# Patient Record
Sex: Male | Born: 1975 | Race: Black or African American | Hispanic: No | Marital: Married | Smoking: Current every day smoker
Health system: Southern US, Community
[De-identification: ages and names within clinical notes are randomized; demographics above are authoritative.]

## PROBLEM LIST (undated history)

## (undated) DIAGNOSIS — M4300 Spondylolysis, site unspecified: Secondary | ICD-10-CM

## (undated) HISTORY — PX: BACK SURGERY: SHX140

---

## 2001-08-23 ENCOUNTER — Encounter: Payer: Self-pay | Admitting: Emergency Medicine

## 2001-08-23 ENCOUNTER — Emergency Department (HOSPITAL_COMMUNITY): Admission: EM | Admit: 2001-08-23 | Discharge: 2001-08-23 | Payer: Self-pay | Admitting: Emergency Medicine

## 2018-02-24 ENCOUNTER — Emergency Department (HOSPITAL_COMMUNITY)
Admission: EM | Admit: 2018-02-24 | Discharge: 2018-02-25 | Disposition: A | Discharge status: Home / Self Care | Payer: No Typology Code available for payment source | Attending: Emergency Medicine | Admitting: Emergency Medicine

## 2018-02-24 ENCOUNTER — Other Ambulatory Visit: Payer: Self-pay

## 2018-02-24 ENCOUNTER — Emergency Department (HOSPITAL_COMMUNITY): Payer: No Typology Code available for payment source

## 2018-02-24 ENCOUNTER — Encounter (HOSPITAL_COMMUNITY): Payer: Self-pay | Admitting: Emergency Medicine

## 2018-02-24 DIAGNOSIS — M4802 Spinal stenosis, cervical region: Secondary | ICD-10-CM | POA: Diagnosis not present

## 2018-02-24 DIAGNOSIS — M4807 Spinal stenosis, lumbosacral region: Secondary | ICD-10-CM | POA: Diagnosis not present

## 2018-02-24 DIAGNOSIS — Y92410 Unspecified street and highway as the place of occurrence of the external cause: Secondary | ICD-10-CM | POA: Diagnosis not present

## 2018-02-24 DIAGNOSIS — Y998 Other external cause status: Secondary | ICD-10-CM | POA: Insufficient documentation

## 2018-02-24 DIAGNOSIS — R200 Anesthesia of skin: Secondary | ICD-10-CM | POA: Diagnosis not present

## 2018-02-24 DIAGNOSIS — Y939 Activity, unspecified: Secondary | ICD-10-CM | POA: Insufficient documentation

## 2018-02-24 DIAGNOSIS — Z789 Other specified health status: Secondary | ICD-10-CM

## 2018-02-24 DIAGNOSIS — M5489 Other dorsalgia: Secondary | ICD-10-CM | POA: Diagnosis not present

## 2018-02-24 DIAGNOSIS — M549 Dorsalgia, unspecified: Secondary | ICD-10-CM

## 2018-02-24 DIAGNOSIS — S0990XA Unspecified injury of head, initial encounter: Secondary | ICD-10-CM | POA: Diagnosis present

## 2018-02-24 LAB — COMPREHENSIVE METABOLIC PANEL
ALT: 17 U/L (ref 0–44)
AST: 30 U/L (ref 15–41)
Albumin: 3.8 g/dL (ref 3.5–5.0)
Alkaline Phosphatase: 65 U/L (ref 38–126)
Anion gap: 8 (ref 5–15)
BUN: 14 mg/dL (ref 6–20)
CO2: 22 mmol/L (ref 22–32)
Calcium: 9.1 mg/dL (ref 8.9–10.3)
Chloride: 107 mmol/L (ref 98–111)
Creatinine, Ser: 1.27 mg/dL — ABNORMAL HIGH (ref 0.61–1.24)
GFR calc Af Amer: >60 mL/min (ref >60)
GFR calc non Af Amer: >60 mL/min (ref >60)
Glucose, Bld: 120 mg/dL — ABNORMAL HIGH (ref 70–99)
Potassium: 3.5 mmol/L (ref 3.5–5.1)
Sodium: 137 mmol/L (ref 135–145)
Total Bilirubin: 0.9 mg/dL (ref 0.3–1.2)
Total Protein: 7.1 g/dL (ref 6.5–8.1)

## 2018-02-24 LAB — CBC
HCT: 40.1 % (ref 39.0–52.0)
Hemoglobin: 13.1 g/dL (ref 13.0–17.0)
MCH: 28.7 pg (ref 26.0–34.0)
MCHC: 32.7 g/dL (ref 30.0–36.0)
MCV: 87.7 fL (ref 80.0–100.0)
Platelets: 304 10*3/uL (ref 150–400)
RBC: 4.57 MIL/uL (ref 4.22–5.81)
RDW: 13.2 % (ref 11.5–15.5)
WBC: 11.2 10*3/uL — ABNORMAL HIGH (ref 4.0–10.5)
nRBC: 0 % (ref 0.0–0.2)

## 2018-02-24 LAB — I-STAT CHEM 8, ED
BUN: 16 mg/dL (ref 6–20)
Calcium, Ion: 1.13 mmol/L — ABNORMAL LOW (ref 1.15–1.40)
Chloride: 104 mmol/L (ref 98–111)
Creatinine, Ser: 1.3 mg/dL — ABNORMAL HIGH (ref 0.61–1.24)
Glucose, Bld: 117 mg/dL — ABNORMAL HIGH (ref 70–99)
HCT: 42 % (ref 39.0–52.0)
Hemoglobin: 14.3 g/dL (ref 13.0–17.0)
Potassium: 3.7 mmol/L (ref 3.5–5.1)
Sodium: 138 mmol/L (ref 135–145)
TCO2: 26 mmol/L (ref 22–32)

## 2018-02-24 LAB — PROTIME-INR
INR: 0.92
Prothrombin Time: 12.3 seconds (ref 11.4–15.2)

## 2018-02-24 LAB — I-STAT CG4 LACTIC ACID, ED: Lactic Acid, Venous: 1.48 mmol/L (ref 0.5–1.9)

## 2018-02-24 LAB — ETHANOL: Alcohol, Ethyl (B): <10 mg/dL (ref <10)

## 2018-02-24 MED ORDER — IOHEXOL 300 MG/ML  SOLN
100.0000 mL | Freq: Once | INTRAMUSCULAR | Status: AC | PRN
  Administered 2018-02-24: 100 mL via INTRAVENOUS

## 2018-02-24 MED ORDER — DIAZEPAM 5 MG/ML IJ SOLN
5.0000 mg | Freq: Once | INTRAMUSCULAR | Status: AC
  Administered 2018-02-24: 5 mg via INTRAVENOUS
  Filled 2018-02-24: qty 2

## 2018-02-24 MED ORDER — HYDROMORPHONE HCL 1 MG/ML IJ SOLN
1.0000 mg | Freq: Once | INTRAMUSCULAR | Status: AC
  Administered 2018-02-24: 1 mg via INTRAVENOUS
  Filled 2018-02-24: qty 1

## 2018-02-24 MED ORDER — ONDANSETRON HCL 4 MG/2ML IJ SOLN
4.0000 mg | Freq: Once | INTRAMUSCULAR | Status: AC
  Administered 2018-02-24: 4 mg via INTRAVENOUS
  Filled 2018-02-24: qty 2
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
## 2018-02-24 NOTE — ED Notes (Signed)
Patient transported to CT 

## 2018-02-25 ENCOUNTER — Emergency Department (HOSPITAL_COMMUNITY): Payer: No Typology Code available for payment source

## 2018-02-25 LAB — CDS SEROLOGY

## 2018-02-25 MED ORDER — HYDROCODONE-ACETAMINOPHEN 5-325 MG PO TABS: 1.0000 | ORAL_TABLET | Freq: Three times a day (TID) | ORAL | 0 refills | Status: AC | PRN

## 2018-02-25 MED ORDER — HYDROMORPHONE HCL 1 MG/ML IJ SOLN
0.5000 mg | Freq: Once | INTRAMUSCULAR | Status: AC
  Administered 2018-02-25: 0.5 mg via INTRAVENOUS
  Filled 2018-02-25: qty 1

## 2018-02-25 MED ORDER — ACETAMINOPHEN 500 MG PO TABS: 1000.0000 mg | ORAL_TABLET | Freq: Three times a day (TID) | ORAL | 0 refills | Status: AC

## 2018-02-25 MED ORDER — HYDROCODONE-ACETAMINOPHEN 5-325 MG PO TABS
1.0000 | ORAL_TABLET | Freq: Once | ORAL | Status: AC
  Administered 2018-02-25: 1 via ORAL
  Filled 2018-02-25: qty 1

## 2018-02-25 MED ORDER — METHYLPREDNISOLONE 4 MG PO TBPK: ORAL_TABLET | ORAL | 0 refills | Status: DC

## 2018-02-25 MED ORDER — DEXAMETHASONE SODIUM PHOSPHATE 10 MG/ML IJ SOLN
10.0000 mg | Freq: Once | INTRAMUSCULAR | Status: AC
  Administered 2018-02-25: 10 mg via INTRAVENOUS
  Filled 2018-02-25: qty 1

## 2018-02-25 NOTE — Discharge Instructions (Addendum)
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  - Disc bulging and facet hypertrophy at L5-S1 with resultant  moderate to advanced bilateral L5 foraminal stenosis.
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
## 2018-02-25 NOTE — ED Notes (Signed)
Patient transported to MRI 

## 2018-03-06 ENCOUNTER — Encounter (HOSPITAL_COMMUNITY): Payer: Self-pay | Admitting: Emergency Medicine

## 2018-03-06 ENCOUNTER — Inpatient Hospital Stay (HOSPITAL_COMMUNITY)
Admission: EM | Admit: 2018-03-06 | Discharge: 2018-03-08 | DRG: 029 | Disposition: A | Discharge status: Home / Self Care | Payer: No Typology Code available for payment source | Attending: Neurological Surgery | Admitting: Neurological Surgery

## 2018-03-06 ENCOUNTER — Inpatient Hospital Stay (HOSPITAL_COMMUNITY): Payer: No Typology Code available for payment source

## 2018-03-06 DIAGNOSIS — Y92410 Unspecified street and highway as the place of occurrence of the external cause: Secondary | ICD-10-CM

## 2018-03-06 DIAGNOSIS — Q7649 Other congenital malformations of spine, not associated with scoliosis: Secondary | ICD-10-CM

## 2018-03-06 DIAGNOSIS — Z79899 Other long term (current) drug therapy: Secondary | ICD-10-CM

## 2018-03-06 DIAGNOSIS — F1910 Other psychoactive substance abuse, uncomplicated: Secondary | ICD-10-CM | POA: Diagnosis present

## 2018-03-06 DIAGNOSIS — M2578 Osteophyte, vertebrae: Secondary | ICD-10-CM | POA: Diagnosis present

## 2018-03-06 DIAGNOSIS — M479 Spondylosis, unspecified: Secondary | ICD-10-CM | POA: Diagnosis present

## 2018-03-06 DIAGNOSIS — Y9301 Activity, walking, marching and hiking: Secondary | ICD-10-CM | POA: Diagnosis present

## 2018-03-06 DIAGNOSIS — W19XXXA Unspecified fall, initial encounter: Secondary | ICD-10-CM | POA: Diagnosis present

## 2018-03-06 DIAGNOSIS — Z419 Encounter for procedure for purposes other than remedying health state, unspecified: Secondary | ICD-10-CM

## 2018-03-06 DIAGNOSIS — G8191 Hemiplegia, unspecified affecting right dominant side: Secondary | ICD-10-CM | POA: Diagnosis present

## 2018-03-06 DIAGNOSIS — R208 Other disturbances of skin sensation: Secondary | ICD-10-CM | POA: Diagnosis present

## 2018-03-06 DIAGNOSIS — M4802 Spinal stenosis, cervical region: Secondary | ICD-10-CM | POA: Diagnosis present

## 2018-03-06 DIAGNOSIS — R200 Anesthesia of skin: Secondary | ICD-10-CM | POA: Diagnosis present

## 2018-03-06 DIAGNOSIS — G959 Disease of spinal cord, unspecified: Secondary | ICD-10-CM | POA: Diagnosis present

## 2018-03-06 DIAGNOSIS — S14129A Central cord syndrome at unspecified level of cervical spinal cord, initial encounter: Principal | ICD-10-CM | POA: Diagnosis present

## 2018-03-06 DIAGNOSIS — S14109A Unspecified injury at unspecified level of cervical spinal cord, initial encounter: Secondary | ICD-10-CM | POA: Diagnosis present

## 2018-03-06 HISTORY — DX: Spondylolysis, site unspecified: M43.00

## 2018-03-06 LAB — CBC WITH DIFFERENTIAL/PLATELET
Abs Immature Granulocytes: 0.04 10*3/uL (ref 0.00–0.07)
Basophils Absolute: 0.1 10*3/uL (ref 0.0–0.1)
Basophils Relative: 1 %
Eosinophils Absolute: 0.6 10*3/uL — ABNORMAL HIGH (ref 0.0–0.5)
Eosinophils Relative: 5 %
HCT: 41.7 % (ref 39.0–52.0)
Hemoglobin: 13.5 g/dL (ref 13.0–17.0)
Immature Granulocytes: 0 %
Lymphocytes Relative: 28 %
Lymphs Abs: 3.4 10*3/uL (ref 0.7–4.0)
MCH: 28.5 pg (ref 26.0–34.0)
MCHC: 32.4 g/dL (ref 30.0–36.0)
MCV: 88.2 fL (ref 80.0–100.0)
Monocytes Absolute: 0.8 10*3/uL (ref 0.1–1.0)
Monocytes Relative: 7 %
Neutro Abs: 7 10*3/uL (ref 1.7–7.7)
Neutrophils Relative %: 59 %
Platelets: 315 10*3/uL (ref 150–400)
RBC: 4.73 MIL/uL (ref 4.22–5.81)
RDW: 12.9 % (ref 11.5–15.5)
WBC: 11.9 10*3/uL — ABNORMAL HIGH (ref 4.0–10.5)
nRBC: 0 % (ref 0.0–0.2)

## 2018-03-06 LAB — PROTIME-INR
INR: 0.98
Prothrombin Time: 12.8 seconds (ref 11.4–15.2)

## 2018-03-06 LAB — COMPREHENSIVE METABOLIC PANEL
ALT: 19 U/L (ref 0–44)
AST: 21 U/L (ref 15–41)
Albumin: 3.8 g/dL (ref 3.5–5.0)
Alkaline Phosphatase: 77 U/L (ref 38–126)
Anion gap: 10 (ref 5–15)
BUN: 9 mg/dL (ref 6–20)
CO2: 23 mmol/L (ref 22–32)
Calcium: 9.1 mg/dL (ref 8.9–10.3)
Chloride: 104 mmol/L (ref 98–111)
Creatinine, Ser: 1.13 mg/dL (ref 0.61–1.24)
GFR calc Af Amer: >60 mL/min (ref >60)
GFR calc non Af Amer: >60 mL/min (ref >60)
Glucose, Bld: 117 mg/dL — ABNORMAL HIGH (ref 70–99)
Potassium: 4.2 mmol/L (ref 3.5–5.1)
Sodium: 137 mmol/L (ref 135–145)
Total Bilirubin: 1.2 mg/dL (ref 0.3–1.2)
Total Protein: 7.2 g/dL (ref 6.5–8.1)

## 2018-03-06 LAB — RAPID URINE DRUG SCREEN, HOSP PERFORMED
Amphetamines: NOT DETECTED
Barbiturates: NOT DETECTED
Benzodiazepines: NOT DETECTED
Cocaine: POSITIVE — AB
Opiates: POSITIVE — AB
Tetrahydrocannabinol: NOT DETECTED

## 2018-03-06 MED ORDER — ONDANSETRON HCL 4 MG PO TABS: 4.0000 mg | ORAL_TABLET | Freq: Four times a day (QID) | ORAL | Status: DC | PRN

## 2018-03-06 MED ORDER — OXYCODONE HCL 5 MG PO TABS
10.0000 mg | ORAL_TABLET | ORAL | Status: DC | PRN
  Administered 2018-03-07 – 2018-03-08 (×4): 10 mg via ORAL
  Filled 2018-03-06 (×4): qty 2

## 2018-03-06 MED ORDER — ACETAMINOPHEN 650 MG RE SUPP: 650.0000 mg | RECTAL | Status: DC | PRN

## 2018-03-06 MED ORDER — SODIUM CHLORIDE 0.9% FLUSH: 3.0000 mL | INTRAVENOUS | Status: DC | PRN

## 2018-03-06 MED ORDER — SODIUM CHLORIDE 0.9 % IV SOLN: INTRAVENOUS | Status: DC

## 2018-03-06 MED ORDER — ONDANSETRON HCL 4 MG/2ML IJ SOLN: 4.0000 mg | Freq: Four times a day (QID) | INTRAMUSCULAR | Status: DC | PRN

## 2018-03-06 MED ORDER — OXYCODONE HCL 5 MG PO TABS
5.0000 mg | ORAL_TABLET | ORAL | Status: DC | PRN
  Filled 2018-03-06: qty 1

## 2018-03-06 MED ORDER — HYDROMORPHONE HCL 1 MG/ML IJ SOLN
0.5000 mg | Freq: Once | INTRAMUSCULAR | Status: AC
  Administered 2018-03-06: 0.5 mg via INTRAVENOUS
  Filled 2018-03-06: qty 1

## 2018-03-06 MED ORDER — DOCUSATE SODIUM 100 MG PO CAPS
100.0000 mg | ORAL_CAPSULE | Freq: Two times a day (BID) | ORAL | Status: DC
  Administered 2018-03-08: 100 mg via ORAL
  Filled 2018-03-06: qty 1

## 2018-03-06 MED ORDER — DIAZEPAM 5 MG PO TABS
10.0000 mg | ORAL_TABLET | Freq: Four times a day (QID) | ORAL | Status: DC | PRN
  Administered 2018-03-07 (×2): 10 mg via ORAL
  Filled 2018-03-06 (×2): qty 2

## 2018-03-06 MED ORDER — ACETAMINOPHEN 325 MG PO TABS: 650.0000 mg | ORAL_TABLET | ORAL | Status: DC | PRN

## 2018-03-06 MED ORDER — PHENOL 1.4 % MT LIQD
1.0000 | OROMUCOSAL | Status: DC | PRN
  Filled 2018-03-06: qty 177

## 2018-03-06 MED ORDER — SODIUM CHLORIDE 0.9% FLUSH
3.0000 mL | Freq: Two times a day (BID) | INTRAVENOUS | Status: DC
  Administered 2018-03-07 – 2018-03-08 (×2): 3 mL via INTRAVENOUS

## 2018-03-06 MED ORDER — HYDROMORPHONE HCL 1 MG/ML IJ SOLN
0.5000 mg | INTRAMUSCULAR | Status: DC | PRN
  Administered 2018-03-07 – 2018-03-08 (×2): 0.5 mg via INTRAVENOUS
  Filled 2018-03-06 (×3): qty 1

## 2018-03-06 MED ORDER — MENTHOL 3 MG MT LOZG
1.0000 | LOZENGE | OROMUCOSAL | Status: DC | PRN
  Filled 2018-03-06: qty 9

## 2018-03-06 MED ORDER — SODIUM CHLORIDE 0.9 % IV SOLN: 250.0000 mL | INTRAVENOUS | Status: DC
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
## 2018-03-06 NOTE — ED Notes (Signed)
Urine Culture sent to main lab with urine drug screen.

## 2018-03-06 NOTE — ED Notes (Signed)
Aspen collar placed with Dr. Maurice Small.
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
## 2018-03-06 NOTE — ED Notes (Signed)
Patient transported to CT 
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
## 2018-03-07 ENCOUNTER — Inpatient Hospital Stay (HOSPITAL_COMMUNITY): Payer: No Typology Code available for payment source | Admitting: Anesthesiology

## 2018-03-07 ENCOUNTER — Inpatient Hospital Stay (HOSPITAL_COMMUNITY): Payer: No Typology Code available for payment source

## 2018-03-07 ENCOUNTER — Encounter (HOSPITAL_COMMUNITY)
Admission: EM | Disposition: A | Discharge status: Home / Self Care | Payer: Self-pay | Source: Home / Self Care | Attending: Neurological Surgery

## 2018-03-07 ENCOUNTER — Encounter (HOSPITAL_COMMUNITY): Payer: Self-pay | Admitting: *Deleted

## 2018-03-07 HISTORY — PX: POSTERIOR CERVICAL FUSION/FORAMINOTOMY: SHX5038

## 2018-03-07 LAB — SURGICAL PCR SCREEN
MRSA, PCR: POSITIVE — AB
Staphylococcus aureus: POSITIVE — AB

## 2018-03-07 SURGERY — POSTERIOR CERVICAL FUSION/FORAMINOTOMY LEVEL 3
Anesthesia: General | Site: Spine Cervical

## 2018-03-07 MED ORDER — DEXAMETHASONE SODIUM PHOSPHATE 10 MG/ML IJ SOLN
INTRAMUSCULAR | Status: DC | PRN
  Administered 2018-03-07: 10 mg via INTRAVENOUS

## 2018-03-07 MED ORDER — LIDOCAINE 2% (20 MG/ML) 5 ML SYRINGE
INTRAMUSCULAR | Status: DC | PRN
  Administered 2018-03-07: 60 mg via INTRAVENOUS

## 2018-03-07 MED ORDER — CHLORHEXIDINE GLUCONATE CLOTH 2 % EX PADS
6.0000 | MEDICATED_PAD | Freq: Every day | CUTANEOUS | Status: DC
  Administered 2018-03-08: 6 via TOPICAL

## 2018-03-07 MED ORDER — PROPOFOL 10 MG/ML IV BOLUS
INTRAVENOUS | Status: DC | PRN
  Administered 2018-03-07: 20 mg via INTRAVENOUS
  Administered 2018-03-07: 160 mg via INTRAVENOUS
  Administered 2018-03-07: 20 mg via INTRAVENOUS

## 2018-03-07 MED ORDER — BACITRACIN ZINC 500 UNIT/GM EX OINT
TOPICAL_OINTMENT | CUTANEOUS | Status: AC
  Filled 2018-03-07: qty 28.35

## 2018-03-07 MED ORDER — THROMBIN 5000 UNITS EX SOLR
CUTANEOUS | Status: AC
  Filled 2018-03-07: qty 5000

## 2018-03-07 MED ORDER — BACITRACIN ZINC 500 UNIT/GM EX OINT
TOPICAL_OINTMENT | CUTANEOUS | Status: DC | PRN
  Administered 2018-03-07: 1 via TOPICAL

## 2018-03-07 MED ORDER — FENTANYL CITRATE (PF) 250 MCG/5ML IJ SOLN
INTRAMUSCULAR | Status: DC | PRN
  Administered 2018-03-07: 25 ug via INTRAVENOUS
  Administered 2018-03-07: 100 ug via INTRAVENOUS
  Administered 2018-03-07: 75 ug via INTRAVENOUS
  Administered 2018-03-07: 50 ug via INTRAVENOUS

## 2018-03-07 MED ORDER — CEFAZOLIN SODIUM-DEXTROSE 2-4 GM/100ML-% IV SOLN
INTRAVENOUS | Status: AC
  Filled 2018-03-07: qty 100

## 2018-03-07 MED ORDER — HYDROMORPHONE HCL 1 MG/ML IJ SOLN: 0.2500 mg | INTRAMUSCULAR | Status: DC | PRN

## 2018-03-07 MED ORDER — ONDANSETRON HCL 4 MG/2ML IJ SOLN
INTRAMUSCULAR | Status: DC | PRN
  Administered 2018-03-07: 4 mg via INTRAVENOUS

## 2018-03-07 MED ORDER — SODIUM CHLORIDE 0.9 % IV SOLN
INTRAVENOUS | Status: DC | PRN
  Administered 2018-03-07: 45 ug/min via INTRAVENOUS

## 2018-03-07 MED ORDER — LIDOCAINE-EPINEPHRINE 1 %-1:100000 IJ SOLN
INTRAMUSCULAR | Status: DC | PRN
  Administered 2018-03-07: 7 mL

## 2018-03-07 MED ORDER — LIDOCAINE-EPINEPHRINE 1 %-1:100000 IJ SOLN
INTRAMUSCULAR | Status: AC
  Filled 2018-03-07: qty 1

## 2018-03-07 MED ORDER — MIDAZOLAM HCL 2 MG/2ML IJ SOLN
INTRAMUSCULAR | Status: AC
  Filled 2018-03-07: qty 2

## 2018-03-07 MED ORDER — THROMBIN 5000 UNITS EX SOLR
OROMUCOSAL | Status: DC | PRN
  Administered 2018-03-07: 16:00:00

## 2018-03-07 MED ORDER — 0.9 % SODIUM CHLORIDE (POUR BTL) OPTIME
TOPICAL | Status: DC | PRN
  Administered 2018-03-07: 1000 mL

## 2018-03-07 MED ORDER — ROCURONIUM BROMIDE 50 MG/5ML IV SOSY
PREFILLED_SYRINGE | INTRAVENOUS | Status: DC | PRN
  Administered 2018-03-07: 10 mg via INTRAVENOUS
  Administered 2018-03-07: 50 mg via INTRAVENOUS

## 2018-03-07 MED ORDER — SODIUM CHLORIDE 0.9 % IV SOLN
INTRAVENOUS | Status: DC | PRN
  Administered 2018-03-07: 16:00:00

## 2018-03-07 MED ORDER — ESMOLOL HCL 100 MG/10ML IV SOLN
INTRAVENOUS | Status: AC
  Filled 2018-03-07: qty 10

## 2018-03-07 MED ORDER — ESMOLOL HCL 100 MG/10ML IV SOLN
INTRAVENOUS | Status: DC | PRN
  Administered 2018-03-07: 30 mg via INTRAVENOUS

## 2018-03-07 MED ORDER — GLYCOPYRROLATE PF 0.2 MG/ML IJ SOSY
PREFILLED_SYRINGE | INTRAMUSCULAR | Status: AC
  Filled 2018-03-07: qty 1

## 2018-03-07 MED ORDER — PHENYLEPHRINE HCL 10 MG/ML IJ SOLN
INTRAMUSCULAR | Status: DC | PRN
  Administered 2018-03-07 (×3): 80 ug via INTRAVENOUS

## 2018-03-07 MED ORDER — ROCURONIUM BROMIDE 50 MG/5ML IV SOSY
PREFILLED_SYRINGE | INTRAVENOUS | Status: AC
  Filled 2018-03-07: qty 5

## 2018-03-07 MED ORDER — DEXAMETHASONE SODIUM PHOSPHATE 10 MG/ML IJ SOLN
INTRAMUSCULAR | Status: AC
  Filled 2018-03-07: qty 1

## 2018-03-07 MED ORDER — MIDAZOLAM HCL 2 MG/2ML IJ SOLN
INTRAMUSCULAR | Status: DC | PRN
  Administered 2018-03-07: 2 mg via INTRAVENOUS

## 2018-03-07 MED ORDER — PROPOFOL 10 MG/ML IV BOLUS
INTRAVENOUS | Status: AC
  Filled 2018-03-07: qty 20

## 2018-03-07 MED ORDER — FENTANYL CITRATE (PF) 250 MCG/5ML IJ SOLN
INTRAMUSCULAR | Status: AC
  Filled 2018-03-07: qty 5

## 2018-03-07 MED ORDER — LIDOCAINE 2% (20 MG/ML) 5 ML SYRINGE
INTRAMUSCULAR | Status: AC
  Filled 2018-03-07: qty 5

## 2018-03-07 MED ORDER — MUPIROCIN 2 % EX OINT
1.0000 "application " | TOPICAL_OINTMENT | Freq: Two times a day (BID) | CUTANEOUS | Status: DC
  Administered 2018-03-08: 1 via NASAL
  Filled 2018-03-07: qty 22

## 2018-03-07 MED ORDER — LACTATED RINGERS IV SOLN
INTRAVENOUS | Status: DC
  Administered 2018-03-07 (×2): via INTRAVENOUS

## 2018-03-07 MED ORDER — EPHEDRINE SULFATE 50 MG/ML IJ SOLN
INTRAMUSCULAR | Status: DC | PRN
  Administered 2018-03-07: 10 mg via INTRAVENOUS

## 2018-03-07 MED ORDER — GLYCOPYRROLATE 0.2 MG/ML IJ SOLN
INTRAMUSCULAR | Status: DC | PRN
  Administered 2018-03-07: 0.1 mg via INTRAVENOUS

## 2018-03-07 MED ORDER — ONDANSETRON HCL 4 MG/2ML IJ SOLN
INTRAMUSCULAR | Status: AC
  Filled 2018-03-07: qty 2

## 2018-03-07 MED ORDER — PROPOFOL 10 MG/ML IV BOLUS
INTRAVENOUS | Status: AC
  Filled 2018-03-07: qty 40

## 2018-03-07 MED ORDER — SUGAMMADEX SODIUM 200 MG/2ML IV SOLN
INTRAVENOUS | Status: DC | PRN
  Administered 2018-03-07: 158 mg via INTRAVENOUS

## 2018-03-07 SURGICAL SUPPLY — 64 items
ADH SKN CLS APL DERMABOND .7 (GAUZE/BANDAGES/DRESSINGS) ×1
APL SKNCLS STERI-STRIP NONHPOA (GAUZE/BANDAGES/DRESSINGS)
BAG DECANTER FOR FLEXI CONT (MISCELLANEOUS) ×3 IMPLANT
BENZOIN TINCTURE PRP APPL 2/3 (GAUZE/BANDAGES/DRESSINGS) IMPLANT
BIT DRILL CENTERPIECE (BIT) IMPLANT
BIT DRILL NEURO 2X3.1 SFT TUCH (MISCELLANEOUS) IMPLANT
BLADE CLIPPER SURG (BLADE) ×3 IMPLANT
BUR MATCHSTICK NEURO 3.0 LAGG (BURR) IMPLANT
BUR PRECISION FLUTE 5.0 (BURR) ×3 IMPLANT
CANISTER SUCT 3000ML PPV (MISCELLANEOUS) ×3 IMPLANT
COVER WAND RF STERILE (DRAPES) ×3 IMPLANT
DECANTER SPIKE VIAL GLASS SM (MISCELLANEOUS) ×3 IMPLANT
DERMABOND ADVANCED (GAUZE/BANDAGES/DRESSINGS) ×2
DERMABOND ADVANCED .7 DNX12 (GAUZE/BANDAGES/DRESSINGS) ×1 IMPLANT
DRAPE C-ARM 42X72 X-RAY (DRAPES) ×6 IMPLANT
DRAPE LAPAROTOMY 100X72 PEDS (DRAPES) ×3 IMPLANT
DRILL BIT CENTERPIECE (BIT) ×3
DRILL NEURO 2X3.1 SOFT TOUCH (MISCELLANEOUS) ×3
DURAPREP 6ML APPLICATOR 50/CS (WOUND CARE) ×3 IMPLANT
ELECT REM PT RETURN 9FT ADLT (ELECTROSURGICAL) ×3
ELECTRODE REM PT RTRN 9FT ADLT (ELECTROSURGICAL) ×1 IMPLANT
GAUZE 4X4 16PLY RFD (DISPOSABLE) IMPLANT
GAUZE SPONGE 4X4 12PLY STRL (GAUZE/BANDAGES/DRESSINGS) IMPLANT
GLOVE BIO SURGEON STRL SZ 6.5 (GLOVE) ×3 IMPLANT
GLOVE BIO SURGEON STRL SZ7 (GLOVE) ×3 IMPLANT
GLOVE BIO SURGEONS STRL SZ 6.5 (GLOVE) ×3
GLOVE BIOGEL PI IND STRL 6.5 (GLOVE) IMPLANT
GLOVE BIOGEL PI IND STRL 7.5 (GLOVE) ×1 IMPLANT
GLOVE BIOGEL PI IND STRL 8 (GLOVE) IMPLANT
GLOVE BIOGEL PI INDICATOR 6.5 (GLOVE) ×4
GLOVE BIOGEL PI INDICATOR 7.5 (GLOVE) ×2
GLOVE BIOGEL PI INDICATOR 8 (GLOVE) ×2
GLOVE EXAM NITRILE LRG STRL (GLOVE) IMPLANT
GLOVE EXAM NITRILE XL STR (GLOVE) IMPLANT
GLOVE EXAM NITRILE XS STR PU (GLOVE) IMPLANT
GOWN STRL REUS W/ TWL LRG LVL3 (GOWN DISPOSABLE) IMPLANT
GOWN STRL REUS W/ TWL XL LVL3 (GOWN DISPOSABLE) ×1 IMPLANT
GOWN STRL REUS W/TWL 2XL LVL3 (GOWN DISPOSABLE) IMPLANT
GOWN STRL REUS W/TWL LRG LVL3 (GOWN DISPOSABLE)
GOWN STRL REUS W/TWL XL LVL3 (GOWN DISPOSABLE) ×3
HEMOSTAT POWDER KIT SURGIFOAM (HEMOSTASIS) ×3 IMPLANT
KIT BASIN OR (CUSTOM PROCEDURE TRAY) ×3 IMPLANT
KIT TURNOVER KIT B (KITS) ×3 IMPLANT
NEEDLE HYPO 22GX1.5 SAFETY (NEEDLE) ×3 IMPLANT
NS IRRIG 1000ML POUR BTL (IV SOLUTION) ×3 IMPLANT
PACK LAMINECTOMY NEURO (CUSTOM PROCEDURE TRAY) ×3 IMPLANT
PAD ARMBOARD 7.5X6 YLW CONV (MISCELLANEOUS) ×9 IMPLANT
PIN MAYFIELD SKULL DISP (PIN) ×3 IMPLANT
PLATE CENTERPIECE 10 TITANIUM (Plate) ×6 IMPLANT
PLATE OPENDOOR CENTERPIECE 10M (Plate) ×2 IMPLANT
PLATE SPINAL OVAL MINI (Plate) ×8 IMPLANT
SCREW BONE 5MM (Screw) ×40 IMPLANT
SCREW SPINAL S/T POST 2.6X7 (Screw) ×22 IMPLANT
SPONGE LAP 4X18 RFD (DISPOSABLE) IMPLANT
STAPLER VISISTAT 35W (STAPLE) ×3 IMPLANT
SUT ETHILON 3 0 FSL (SUTURE) IMPLANT
SUT MNCRL AB 3-0 PS2 18 (SUTURE) ×3 IMPLANT
SUT VIC AB 0 CT1 18XCR BRD8 (SUTURE) ×1 IMPLANT
SUT VIC AB 0 CT1 8-18 (SUTURE) ×3
SUT VIC AB 2-0 CT1 18 (SUTURE) ×7 IMPLANT
TOWEL GREEN STERILE (TOWEL DISPOSABLE) ×3 IMPLANT
TOWEL GREEN STERILE FF (TOWEL DISPOSABLE) ×3 IMPLANT
UNDERPAD 30X30 (UNDERPADS AND DIAPERS) ×3 IMPLANT
WATER STERILE IRR 1000ML POUR (IV SOLUTION) ×3 IMPLANT

## 2018-03-07 NOTE — Anesthesia Preprocedure Evaluation (Addendum)
Anesthesia Evaluation  Patient identified by MRN, date of birth, ID band Patient awake    Reviewed: Allergy & Precautions, H&P , NPO status , Patient's Chart, lab work & pertinent test results  Airway Mallampati: II  TM Distance: >3 FB Neck ROM: Full    Dental no notable dental hx. (+) Poor Dentition, Dental Advisory Given   Pulmonary neg pulmonary ROS,    Pulmonary exam normal breath sounds clear to auscultation       Cardiovascular negative cardio ROS   Rhythm:Regular Rate:Normal     Neuro/Psych negative neurological ROS  negative psych ROS   GI/Hepatic negative GI ROS, (+)     substance abuse  cocaine use,   Endo/Other  negative endocrine ROS  Renal/GU negative Renal ROS  negative genitourinary   Musculoskeletal   Abdominal   Peds  Hematology negative hematology ROS (+)   Anesthesia Other Findings   Reproductive/Obstetrics negative OB ROS                           Anesthesia Physical Anesthesia Plan  ASA: II  Anesthesia Plan: General   Post-op Pain Management:    Induction: Intravenous  PONV Risk Score and Plan: 3 and Ondansetron, Dexamethasone and Midazolam  Airway Management Planned: Oral ETT  Additional Equipment:   Intra-op Plan:   Post-operative Plan: Extubation in OR  Informed Consent: I have reviewed the patients History and Physical, chart, labs and discussed the procedure including the risks, benefits and alternatives for the proposed anesthesia with the patient or authorized representative who has indicated his/her understanding and acceptance.     Dental advisory given  Plan Discussed with: CRNA  Anesthesia Plan Comments:         Anesthesia Quick Evaluation

## 2018-03-07 NOTE — Transfer of Care (Signed)
Immediate Anesthesia Transfer of Care Note  Patient: Garrett PellegriniBrad J Allinson  Procedure(s) Performed: POSTERIOR CERVICAL LAMINAPLASTY CERVICAL THREE-CERVICAL SIX (N/A Spine Cervical)  Patient Location: PACU  Anesthesia Type:General  Level of Consciousness: drowsy and patient cooperative  Airway & Oxygen Therapy: Patient Spontanous Breathing and Patient connected to face mask oxygen  Post-op Assessment: Report given to RN and Post -op Vital signs reviewed and stable  Post vital signs: Reviewed and stable  Last Vitals:  Vitals Value Taken Time  BP    Temp 36.5 C 03/07/2018  6:39 PM  Pulse 113 03/07/2018  6:40 PM  Resp 15 03/07/2018  6:40 PM  SpO2 100 % 03/07/2018  6:40 PM  Vitals shown include unvalidated device data.  Last Pain:  Vitals:   03/07/18 1142  TempSrc: Oral  PainSc:          Complications: No apparent anesthesia complications

## 2018-03-07 NOTE — Anesthesia Procedure Notes (Signed)
Procedure Name: Intubation Date/Time: 03/07/2018 3:23 PM Performed by: Modena Morrow, CRNA Pre-anesthesia Checklist: Patient identified, Suction available, Patient being monitored, Timeout performed and Emergency Drugs available Patient Re-evaluated:Patient Re-evaluated prior to induction Oxygen Delivery Method: Circle system utilized Preoxygenation: Pre-oxygenation with 100% oxygen Induction Type: IV induction Ventilation: Mask ventilation without difficulty and Oral airway inserted - appropriate to patient size Laryngoscope Size: Glidescope and 3 Grade View: Grade I Tube type: Oral Number of attempts: 1 Airway Equipment and Method: Stylet and Oral airway Placement Confirmation: ETT inserted through vocal cords under direct vision Secured at: 23 cm Tube secured with: Tape Dental Injury: Teeth and Oropharynx as per pre-operative assessment

## 2018-03-07 NOTE — Progress Notes (Signed)
PT Cancellation Note  Patient Details Name: Garrett Richardson MRN: [id number] DOB: 18-Aug-1975   Cancelled Treatment:    Reason Eval/Treat Not Completed: Medical issues which prohibited therapy;Patient at procedure or test/unavailable.  Pt has proceeded to cervical surgery today.  Will see 1/16 as able. 03/07/2018  Marthasville Bing, PT Acute Rehabilitation Services [phone]  (pager) [phone]  (office)   Garrett Richardson 03/07/2018, 3:50 PM
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
## 2018-03-07 NOTE — Anesthesia Postprocedure Evaluation (Signed)
Anesthesia Post Note  Patient: Garrett Richardson  Procedure(s) Performed: POSTERIOR CERVICAL LAMINAPLASTY CERVICAL THREE-CERVICAL SIX (N/A Spine Cervical)     Patient location during evaluation: PACU Anesthesia Type: General Level of consciousness: awake and alert Pain management: pain level controlled Vital Signs Assessment: post-procedure vital signs reviewed and stable Respiratory status: spontaneous breathing, nonlabored ventilation, respiratory function stable and patient connected to nasal cannula oxygen Cardiovascular status: blood pressure returned to baseline and stable Postop Assessment: no apparent nausea or vomiting Anesthetic complications: no    Last Vitals:  Vitals:   03/07/18 2009 03/07/18 2041  BP: 111/75 (!) 149/94  Pulse: 79   Resp: 13 14  Temp: 36.5 C 37.4 C  SpO2: 98% 100%    Last Pain:  Vitals:   03/07/18 2009  TempSrc:   PainSc: Asleep                 Sumaiya Arruda COKER

## 2018-03-07 NOTE — Progress Notes (Signed)
Neurosurgery Service Post-operative progress note  Assessment & Plan: 43 y.o. 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 Seen in recovery, MAEx4, still recovering from anesthesia. -transfer to 4NP when recovered -no collar needed  -advance diet as tolerated -activity as tolerated  [name]  03/07/18 6:41 PM

## 2018-03-07 NOTE — Progress Notes (Signed)
OT Cancellation Note  Patient Details Name: Garrett Richardson MRN: [id number] DOB: 23-Dec-1975   Cancelled Treatment:    Reason Eval/Treat Not Completed: Patient at procedure or test/ unavailable(Surgery. Will return as schedule allows. )  Hedda Crumbley M Briona Korpela Debbrah Sampedro MSOT, OTR/L Acute Rehab Pager: [phone] Office: [phone] 03/07/2018, 2:49 PM
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
## 2018-03-07 NOTE — Brief Op Note (Signed)
03/07/2018  6:23 PM  PATIENT:  Garrett JarvisBrad J Richardson  43 y.o. male  PRE-OPERATIVE DIAGNOSIS:  Cervical Myelopathy  POST-OPERATIVE DIAGNOSIS:  Cervical Myelopathy  PROCEDURE:  Procedure(s) with comments: POSTERIOR CERVICAL LAMINAPLASTY CERVICAL THREE-CERVICAL SIX (N/A) - POSTERIOR CERVICAL LAMINAPLASTY CERVICAL THREE-CERVICAL SIX  SURGEON:  Surgeon(s) and Role:    * Lois Slagel, Clovis Puhomas A, MD - Primary  PHYSICIAN ASSISTANT:   ASSISTANTS: none   ANESTHESIA:   general  EBL:  150 mL   BLOOD ADMINISTERED:none  DRAINS: none   LOCAL MEDICATIONS USED:  LIDOCAINE   SPECIMEN:  No Specimen  DISPOSITION OF SPECIMEN:  N/A  COUNTS:  YES  TOURNIQUET:  * No tourniquets in log *  DICTATION: .Note written in EPIC  PLAN OF CARE: Admit to inpatient   PATIENT DISPOSITION:  PACU - hemodynamically stable.   Delay start of Pharmacological VTE agent (>24hrs) due to surgical blood loss or risk of bleeding: yes

## 2018-03-07 NOTE — Plan of Care (Signed)

## 2018-03-08 MED ORDER — OXYCODONE HCL 5 MG PO TABS: 5.0000 mg | ORAL_TABLET | ORAL | 0 refills | Status: DC | PRN

## 2018-03-08 MED ORDER — DOCUSATE SODIUM 100 MG PO CAPS: 100.0000 mg | ORAL_CAPSULE | Freq: Two times a day (BID) | ORAL | 0 refills | Status: DC

## 2018-03-08 MED ORDER — CYCLOBENZAPRINE HCL 10 MG PO TABS: 10.0000 mg | ORAL_TABLET | Freq: Three times a day (TID) | ORAL | 2 refills | Status: DC | PRN

## 2018-03-08 NOTE — Discharge Summary (Signed)
Discharge Summary  Date of Admission: 03/06/2018  Date of Discharge: 03/08/18  Attending Physician: Autumn Patty, MD  Hospital Course: Patient was admitted after he fell and had significant right sided arm and leg weakness. A previous MRI had shown congenital cervical stenosis with cord signal change. A CT C-spine showed no new fracture. Given his new neurologic deficit, he was taken to the OR for an uncomplicated C3-6 laminoplasty. He was recovered in PACU and transferred to 4NP. His strength on the right side was only 1-2/5 at admission, but on POD1, it had already improved to 4+/5. His hospital course was uncomplicated and the patient was discharged home on 03/08/2018. He will follow up in clinic with me in 2 weeks.  Neurologic exam at discharge:  AOx3, PERRL, EOMI, FS, TM Strength 4+/5 x4, decreased sensation in RUE/RLE but improving  [name], MD 03/08/18 10:32 AM

## 2018-03-08 NOTE — Discharge Instructions (Signed)
Discharge Instructions ° °No restriction in activities, slowly increase your activity back to normal.  ° °Your incision is closed with staples. We will remove these in clinic in 2 weeks at your follow up appointment.  ° °Okay to shower on the day of discharge. Use regular soap and water and try to be gentle when cleaning your incision. No need for a dressing on your incision. In the first few days after surgery, there may be some bloody drainage from your wound. If this happens, you can cover your incision with a gauze dressing to prevent it from staining your clothes or bed linens. If your incision begins to itch, rub some bacitracin or neosporin ointment on it instead of scratching it. ° °Follow up with Dr. Aariv Medlock in 2 weeks after discharge. If you do not already have a discharge appointment, please call his office at [phone] to schedule a follow up appointment. If you have any concerns or questions, please call the office and let us know. °
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
## 2018-03-08 NOTE — Plan of Care (Signed)
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
## 2018-03-08 NOTE — Progress Notes (Signed)
Neurosurgery Service Progress Note  Subjective: No acute events overnight, strength subjectively significantly improved immediately after surgery   Objective: Vitals:   03/07/18 2000 03/07/18 2009 03/07/18 2041 03/08/18 0743  BP:  111/75 (!) 149/94 119/72  Pulse: 86 79  77  Resp: 17 13 14 14   Temp:  97.7 F (36.5 C) 99.4 F (37.4 C) 97.6 F (36.4 C)  TempSrc:    Oral  SpO2: 97% 98% 100% 100%  Weight:      Height:       Temp (24hrs), Avg:98.1 F (36.7 C), Min:97.6 F (36.4 C), Max:99.4 F (37.4 C)  CBC Latest Ref Rng & Units 03/06/2018 02/24/2018 02/24/2018  WBC 4.0 - 10.5 K/uL 11.9(H) - 11.2(H)  Hemoglobin 13.0 - 17.0 g/dL 51.8 84.1 66.0  Hematocrit 39.0 - 52.0 % 41.7 42.0 40.1  Platelets 150 - 400 K/uL 315 - 304   BMP Latest Ref Rng & Units 03/06/2018 02/24/2018 02/24/2018  Glucose 70 - 99 mg/dL 630(Z) 601(U) 932(T)  BUN 6 - 20 mg/dL 9 16 14   Creatinine 0.61 - 1.24 mg/dL 5.57 3.22(G) 2.54(Y)  Sodium 135 - 145 mmol/L 137 138 137  Potassium 3.5 - 5.1 mmol/L 4.2 3.7 3.5  Chloride 98 - 111 mmol/L 104 104 107  CO2 22 - 32 mmol/L 23 - 22  Calcium 8.9 - 10.3 mg/dL 9.1 - 9.1    Intake/Output Summary (Last 24 hours) at 03/08/2018 1028 Last data filed at 03/08/2018 0500 Gross per 24 hour  Intake 2250 ml  Output 950 ml  Net 1300 ml    Current Facility-Administered Medications:  .  0.9 %  sodium chloride infusion, 250 mL, Intravenous, Continuous, Demarco Bacci A, MD .  0.9 %  sodium chloride infusion, , Intravenous, Continuous, Remigio Mcmillon, Clovis Pu, MD .  acetaminophen (TYLENOL) tablet 650 mg, 650 mg, Oral, Q4H PRN **OR** acetaminophen (TYLENOL) suppository 650 mg, 650 mg, Rectal, Q4H PRN, [name], MD .  Chlorhexidine Gluconate Cloth 2 % PADS 6 each, 6 each, Topical, Q0600, [name], MD, 6 each at 03/08/18 0541 .  diazepam (VALIUM) tablet 10 mg, 10 mg, Oral, Q6H PRN, [name], MD, 10 mg at 03/07/18 2345 .  docusate sodium (COLACE) capsule 100 mg,  100 mg, Oral, BID, [name], MD, 100 mg at 03/08/18 0943 .  HYDROmorphone (DILAUDID) injection 0.5 mg, 0.5 mg, Intravenous, Q3H PRN, [name], MD, 0.5 mg at 03/08/18 0659 .  lactated ringers infusion, , Intravenous, Continuous, Tommi Crepeau, Clovis Pu, MD, Last Rate: 10 mL/hr at 03/07/18 1455 .  menthol-cetylpyridinium (CEPACOL) lozenge 3 mg, 1 lozenge, Oral, PRN **OR** phenol (CHLORASEPTIC) mouth spray 1 spray, 1 spray, Mouth/Throat, PRN, Keoni Risinger, Clovis Pu, MD .  mupirocin ointment (BACTROBAN) 2 % 1 application, 1 application, Nasal, BID, [name], MD, 1 application at 03/08/18 [phone] .  ondansetron (ZOFRAN) tablet 4 mg, 4 mg, Oral, Q6H PRN **OR** ondansetron (ZOFRAN) injection 4 mg, 4 mg, Intravenous, Q6H PRN, Ebba Goll A, MD .  oxyCODONE (Oxy IR/ROXICODONE) immediate release tablet 10 mg, 10 mg, Oral, Q3H PRN, [name], MD, 10 mg at 03/08/18 0943 .  oxyCODONE (Oxy IR/ROXICODONE) immediate release tablet 5 mg, 5 mg, Oral, Q3H PRN, Marigold Mom A, MD .  sodium chloride flush (NS) 0.9 % injection 3 mL, 3 mL, Intravenous, Q12H, Tanaja Ganger, Clovis Pu, MD, 3 mL at 03/08/18 0944 .  sodium chloride flush (NS) 0.9 % injection 3 mL, 3 mL, Intravenous, PRN, [name], MD  Physical Exam: AOx3, PERRL, EOMI, FS, Strength 4+/5x4, R hemibody numbness but improving, +Hoffman's b/l, incision c/d/i  Assessment & Plan: 43 y.o. man s/p C3-6 laminoplasty for severe right sided weakness, recovering extremely well - much better than usual disease course. -Pt ambulating w/o assistance, tolreating a regular diet, and pain controlled on po medications. He'd like to go home today, okay for discharge early this afternoon -SCDs/TEDs -no collar needed, can wear a soft collar for comfort  [name]  03/08/18 10:28 AM

## 2018-03-12 ENCOUNTER — Encounter (HOSPITAL_COMMUNITY): Payer: Self-pay | Admitting: Neurological Surgery

## 2018-03-18 ENCOUNTER — Other Ambulatory Visit: Payer: Self-pay

## 2018-03-18 ENCOUNTER — Encounter (HOSPITAL_COMMUNITY): Payer: Self-pay | Admitting: Emergency Medicine

## 2018-03-18 ENCOUNTER — Emergency Department (HOSPITAL_COMMUNITY)
Admission: EM | Admit: 2018-03-18 | Discharge: 2018-03-19 | Disposition: A | Discharge status: Home / Self Care | Payer: No Typology Code available for payment source | Attending: Emergency Medicine | Admitting: Emergency Medicine

## 2018-03-18 DIAGNOSIS — G8918 Other acute postprocedural pain: Secondary | ICD-10-CM | POA: Insufficient documentation

## 2018-03-18 DIAGNOSIS — Z4889 Encounter for other specified surgical aftercare: Secondary | ICD-10-CM | POA: Diagnosis not present

## 2018-03-18 DIAGNOSIS — M542 Cervicalgia: Secondary | ICD-10-CM | POA: Diagnosis present

## 2018-03-18 MED ORDER — ONDANSETRON HCL 4 MG/2ML IJ SOLN
4.0000 mg | Freq: Once | INTRAMUSCULAR | Status: AC
  Administered 2018-03-19: 4 mg via INTRAVENOUS
  Filled 2018-03-18: qty 2

## 2018-03-18 MED ORDER — HYDROMORPHONE HCL 1 MG/ML IJ SOLN
1.0000 mg | Freq: Once | INTRAMUSCULAR | Status: AC
  Administered 2018-03-19: 1 mg via INTRAVENOUS
  Filled 2018-03-18: qty 1

## 2018-03-18 MED ORDER — MORPHINE SULFATE (PF) 4 MG/ML IV SOLN: 4.0000 mg | Freq: Once | INTRAVENOUS | Status: DC

## 2018-03-18 NOTE — ED Provider Notes (Signed)
[facility]-EMERGENCY DEPT Provider Note   CSN: [id number] Arrival date & time: 03/18/18  2219     History   Chief Complaint Chief Complaint  Patient presents with  . Post-op Problem    HPI Garrett Richardson is a 43 y.o. male.   43 year old male with a history of spondylolysis and spinal cord injury, 12 days s/p cervical laminoplasty of C3-C6 by Dr. Maurice Small, presents to the emergency department for evaluation of drainage from his postoperative wound site.  Wife reporting increased drainage over the past 2 days.  The patient has also been experiencing increasing neck pain, but he has been out of his pain medication.  He is not scheduled to follow-up with his neurosurgeon until Wednesday.  Initially called the office on Friday, but was told he would not receive a call back until Monday.  Wife expresses concern over patient seeming as though he cannot support his head weight.  He is also reports weight loss since the surgery and subjective weakness.  Patient reports onset of a new tremor; this affects his head mostly, but can include his extremities.  He reports being told that there was no utility in use of a cervical collar postoperatively.  He has not had any sensation changes or bowel/bladder incontinence.  No documented fevers PTA.  The history is provided by the patient and the spouse. No language interpreter was used.    Past Medical History:  Diagnosis Date  . Spondylolysis     Patient Active Problem List   Diagnosis Date Noted  . Spinal cord injury, cervical region [facility]) 03/06/2018    Past Surgical History:  Procedure Laterality Date  . POSTERIOR CERVICAL FUSION/FORAMINOTOMY N/A 03/07/2018   Procedure: POSTERIOR CERVICAL LAMINAPLASTY CERVICAL THREE-CERVICAL SIX;  Surgeon: [name], MD;  Location: [facility] OR;  Service: Neurosurgery;  Laterality: N/A;  POSTERIOR CERVICAL LAMINAPLASTY CERVICAL THREE-CERVICAL SIX        Home Medications    Prior  to Admission medications   Medication Sig Start Date End Date Taking? Authorizing Provider  cyclobenzaprine (FLEXERIL) 10 MG tablet Take 1 tablet (10 mg total) by mouth 3 (three) times daily as needed for muscle spasms. 03/08/18  Yes [name], MD  docusate sodium (COLACE) 100 MG capsule Take 1 capsule (100 mg total) by mouth 2 (two) times daily. 03/08/18  Yes [name], MD  cephALEXin (KEFLEX) 500 MG capsule Take 1 capsule (500 mg total) by mouth 4 (four) times daily for 7 days. 03/19/18 03/26/18  Antony Madura, PA-C  oxyCODONE (OXY IR/ROXICODONE) 5 MG immediate release tablet Take 1 tablet (5 mg total) by mouth every 6 (six) hours as needed for moderate pain or severe pain (pain). 03/19/18   Antony Madura, PA-C    Family History No family history on file.  Social History Social History   Tobacco Use  . Smoking status: Not on file  Substance Use Topics  . Alcohol use: Not on file  . Drug use: Not on file     Allergies   Morphine and related and Banana   Review of Systems Review of Systems Ten systems reviewed and are negative for acute change, except as noted in the HPI.    Physical Exam Updated Vital Signs BP 102/73 (BP Location: Right Arm)   Pulse 69   Temp 98.7 F (37.1 C) (Oral)   Resp 20   Ht 5\' 6"  (1.676 m)   Wt 67.7 kg   SpO2 98%   BMI 24.08  kg/m   Physical Exam Vitals signs and nursing note reviewed.  Constitutional:      General: He is not in acute distress.    Appearance: He is well-developed. He is not diaphoretic.     Comments: Nontoxic-appearing.  Anxious.  HENT:     Head: Normocephalic and atraumatic.     Mouth/Throat:     Comments: Tolerating secretions. Normal phonation. Eyes:     General: No scleral icterus.    Conjunctiva/sclera: Conjunctivae normal.  Neck:     Musculoskeletal: Normal range of motion.     Comments: No active drainage or significant swelling at postoperative site. No induration, heat to touch. Staples intact.  Some crusting noted at inferior aspect of incision, likely from crusting of serous drainage. No crepitus to neck. Cardiovascular:     Rate and Rhythm: Normal rate and regular rhythm.     Pulses: Normal pulses.  Pulmonary:     Effort: Pulmonary effort is normal. No respiratory distress.     Comments: Respirations even and unlabored Musculoskeletal: Normal range of motion.  Skin:    General: Skin is warm and dry.     Coloration: Skin is not pale.     Findings: No rash.  Neurological:     Mental Status: He is alert and oriented to person, place, and time.     Comments: GCS 15. Speech is goal oriented. Patient answers questions appropriately and follows commands. No obvious cranial nerve deficits. Sensation to light touch intact bilaterally. He has preserved 5/5 strength on the left side. Noted to have 4-4+/5 strength in the LUE, 4+/5 in the LLE. Resting tremor throughout body. Patellar reflexes 1+ on the right and 3+ on the left.  Psychiatric:        Behavior: Behavior normal.        ED Treatments / Results  Labs (all labs ordered are listed, but only abnormal results are displayed) Labs Reviewed  CBC WITH DIFFERENTIAL/PLATELET - Abnormal; Notable for the following components:      Result Value   Hemoglobin 12.0 (*)    HCT 37.7 (*)    Platelets 482 (*)    All other components within normal limits  COMPREHENSIVE METABOLIC PANEL - Abnormal; Notable for the following components:   Glucose, Bld 123 (*)    All other components within normal limits  C-REACTIVE PROTEIN - Abnormal; Notable for the following components:   CRP 1.2 (*)    All other components within normal limits  CULTURE, BLOOD (ROUTINE X 2)  CULTURE, BLOOD (ROUTINE X 2)  LACTIC ACID, PLASMA  I-STAT CG4 LACTIC ACID, ED    EKG None  Radiology Ct Head Wo Contrast  Result Date: 03/19/2018 CLINICAL DATA:  Recent spine surgery, increased wound drainage EXAM: CT HEAD WITHOUT CONTRAST TECHNIQUE: Contiguous axial images  were obtained from the base of the skull through the vertex without intravenous contrast. COMPARISON:  02/24/2018 FINDINGS: Brain: No acute territorial infarction, hemorrhage or intracranial mass. The ventricles are nonenlarged. Vascular: No hyperdense vessel or unexpected calcification. Skull: Normal. Negative for fracture or focal lesion. Sinuses/Orbits: Mucosal thickening in the maxillary sinuses. Other: None IMPRESSION: Negative non contrasted CT appearance of the brain Electronically Signed   By: Jasmine PangKim  Fujinaga M.D.   On: 03/19/2018 01:15   Ct Cervical Spine W Contrast  Result Date: 03/19/2018 CLINICAL DATA:  Neck pain recent surgery EXAM: CT CERVICAL SPINE WITH CONTRAST TECHNIQUE: Multidetector CT imaging of the cervical spine was performed during intravenous contrast administration. Multiplanar CT image reconstructions  were also generated. CONTRAST:  ISOVUE-300 IOPAMIDOL (ISOVUE-300) INJECTION 61% COMPARISON:  CT 03/06/2018, MRI 02/25/2018 FINDINGS: Alignment: Straightening of the cervical spine.  No subluxation. Skull base and vertebrae: Craniovertebral junction is intact. There is no acute fracture identified. Vertebral body heights are maintained. Circumscribed lucent lesion in the spinous process of C2 again noted. Soft tissues and spinal canal: Posterior cutaneous staples. Large posterior midline fluid collection containing large amount of gas, this extends from C2 to around the spinous process at C7. Collection measures 2.4 cm maximum transverse by 3.5 cm maximum AP by 6.6 cm craniocaudad. Edema within the subcutaneous soft tissues of the posterior neck. Disc levels: Mild degenerative changes at C5-C6. Calcifications posterior to C6. Interval laminoplasty changes C3 through C6 with cerclage wires and multiple small fixating screws. Left fixating screw at C5 is only partially anchored within the C5 facet. Linear defect within the left facet at C6 without anchoring screw. Cephalad fixating screw  on the right at C5 also does not appear anchored within the bone. Upper chest: Lung apices are clear.  Thyroid gland is normal. Other: None IMPRESSION: 1. Posterior laminoplasty changes from C3 through C6. Large posterior midline gas and fluid collection measuring 2.4 x 3.5 x 6.6 cm overlying the surgical site. Amount of gas is somewhat more than would be expected for routine postoperative gas. 2. Posterior wires and fixating screws with left screws at C5 and C6 and right screw at C5 not anchored within the bone. Electronically Signed   By: Jasmine Pang M.D.   On: 03/19/2018 01:50    Procedures Procedures (including critical care time)  Medications Ordered in ED Medications  iopamidol (ISOVUE-300) 61 % injection (has no administration in time range)  sodium chloride (PF) 0.9 % injection (has no administration in time range)  ondansetron (ZOFRAN) injection 4 mg (4 mg Intravenous Given 03/19/18 0003)  HYDROmorphone (DILAUDID) injection 1 mg (1 mg Intravenous Given 03/19/18 0005)  iopamidol (ISOVUE-300) 61 % injection 100 mL (100 mLs Intravenous Contrast Given 03/19/18 0047)  ketorolac (TORADOL) 30 MG/ML injection 15 mg (15 mg Intravenous Given 03/19/18 0123)  cephALEXin (KEFLEX) capsule 500 mg (500 mg Oral Given 03/19/18 0330)  oxyCODONE (Oxy IR/ROXICODONE) immediate release tablet 5 mg (5 mg Oral Given 03/19/18 0330)    2:40 AM Case discussed with Dr. Danielle Dess of neurosurgery.  Dr. Danielle Dess has reviewed the patient's postoperative wound through the picture posted as well as results of the CT scan.  He notes the fluid collection on imaging, believes it is largely nonspecific.  Does advise starting the patient on 500 mg Keflex 4 times daily.  Will discuss case with Dr. Maurice Small to try and facilitate office follow up tomorrow.  3:00 AM Will provide additional prescription for oxycodone for pain control.  Patient has only received a 5-day   Initial Impression / Assessment and Plan / ED Course  I have  reviewed the triage vital signs and the nursing notes.  Pertinent labs & imaging results that were available during my care of the patient were reviewed by me and considered in my medical decision making (see chart for details).     43 year old male presents to the emergency department for evaluation of neck pain.  He is 12 days status post laminoplasty to C3-C6 by Dr. Maurice Small.  Patient with generally reassuring neurologic exam.  His exam appears consistent with his baseline at discharge post surgery.  Patient expresses concern for infection to his surgical site given drainage prior to arrival.  He  has had no active drainage while in the ED.  No purulence noted.  Question whether patient may be experiencing some serous or serosanguineous drainage which is to be expected postop.  He is afebrile in the emergency department without leukocytosis or left shift.  CRP is generally reassuring and lactate is normal.  He underwent CT scanning with contrast to further evaluate his surgical site.  This shows a nonspecific fluid collection measuring 2.4 x 3.5 x 6.6 cm associated with subcutaneous gas.  This overlies the surgical midline.  Fluid collection and gas are nonspecific.  Findings could represent local postoperative edema, though infection not excluded.  Laboratory evaluation and surgical site on exam certainly do not raise concern for acute infection at this time.  I have discussed these findings with Dr. Danielle Dess on-call for neurosurgery who recommends starting the patient on 500 mg Keflex 4 times daily.  The patient was given an initial dose of Keflex in the emergency department.  He has been encouraged to follow-up with Dr. Maurice Small in the office within the next 24 hours.  Will also discharge with short course of Oxycodone for ongoing pain control.  Return precautions discussed and provided. Patient discharged in stable condition with no unaddressed concerns.   Final Clinical Impressions(s) / ED  Diagnoses   Final diagnoses:  Post-operative pain    ED Discharge Orders         Ordered    oxyCODONE (OXY IR/ROXICODONE) 5 MG immediate release tablet  Every 6 hours PRN     03/19/18 0307    cephALEXin (KEFLEX) 500 MG capsule  4 times daily     03/19/18 0307           Antony Madura, PA-C 03/19/18 1610    Glynn Octave, MD 03/19/18 [phone]
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
## 2018-03-19 ENCOUNTER — Emergency Department (HOSPITAL_COMMUNITY): Payer: No Typology Code available for payment source

## 2018-03-19 LAB — COMPREHENSIVE METABOLIC PANEL
ALT: 20 U/L (ref 0–44)
AST: 20 U/L (ref 15–41)
Albumin: 3.5 g/dL (ref 3.5–5.0)
Alkaline Phosphatase: 74 U/L (ref 38–126)
Anion gap: 8 (ref 5–15)
BUN: 15 mg/dL (ref 6–20)
CO2: 24 mmol/L (ref 22–32)
Calcium: 9.2 mg/dL (ref 8.9–10.3)
Chloride: 103 mmol/L (ref 98–111)
Creatinine, Ser: 1.02 mg/dL (ref 0.61–1.24)
GFR calc Af Amer: >60 mL/min (ref >60)
GFR calc non Af Amer: >60 mL/min (ref >60)
Glucose, Bld: 123 mg/dL — ABNORMAL HIGH (ref 70–99)
Potassium: 3.9 mmol/L (ref 3.5–5.1)
Sodium: 135 mmol/L (ref 135–145)
Total Bilirubin: 0.3 mg/dL (ref 0.3–1.2)
Total Protein: 7.4 g/dL (ref 6.5–8.1)

## 2018-03-19 LAB — CBC WITH DIFFERENTIAL/PLATELET
Abs Immature Granulocytes: 0.04 10*3/uL (ref 0.00–0.07)
Basophils Absolute: 0.1 10*3/uL (ref 0.0–0.1)
Basophils Relative: 1 %
Eosinophils Absolute: 0.4 10*3/uL (ref 0.0–0.5)
Eosinophils Relative: 4 %
HCT: 37.7 % — ABNORMAL LOW (ref 39.0–52.0)
Hemoglobin: 12 g/dL — ABNORMAL LOW (ref 13.0–17.0)
Immature Granulocytes: 0 %
Lymphocytes Relative: 36 %
Lymphs Abs: 3.5 10*3/uL (ref 0.7–4.0)
MCH: 28.4 pg (ref 26.0–34.0)
MCHC: 31.8 g/dL (ref 30.0–36.0)
MCV: 89.1 fL (ref 80.0–100.0)
Monocytes Absolute: 0.8 10*3/uL (ref 0.1–1.0)
Monocytes Relative: 8 %
Neutro Abs: 4.9 10*3/uL (ref 1.7–7.7)
Neutrophils Relative %: 51 %
Platelets: 482 10*3/uL — ABNORMAL HIGH (ref 150–400)
RBC: 4.23 MIL/uL (ref 4.22–5.81)
RDW: 12.1 % (ref 11.5–15.5)
WBC: 9.7 10*3/uL (ref 4.0–10.5)
nRBC: 0 % (ref 0.0–0.2)

## 2018-03-19 LAB — C-REACTIVE PROTEIN: CRP: 1.2 mg/dL — ABNORMAL HIGH (ref <1.0)

## 2018-03-19 LAB — LACTIC ACID, PLASMA: Lactic Acid, Venous: 1.6 mmol/L (ref 0.5–1.9)

## 2018-03-19 MED ORDER — IOPAMIDOL (ISOVUE-300) INJECTION 61%
INTRAVENOUS | Status: AC
  Filled 2018-03-19: qty 100

## 2018-03-19 MED ORDER — CEPHALEXIN 500 MG PO CAPS
500.0000 mg | ORAL_CAPSULE | Freq: Once | ORAL | Status: AC
  Administered 2018-03-19: 500 mg via ORAL
  Filled 2018-03-19: qty 1

## 2018-03-19 MED ORDER — KETOROLAC TROMETHAMINE 30 MG/ML IJ SOLN
15.0000 mg | Freq: Once | INTRAMUSCULAR | Status: AC
  Administered 2018-03-19: 15 mg via INTRAVENOUS
  Filled 2018-03-19: qty 1

## 2018-03-19 MED ORDER — SODIUM CHLORIDE (PF) 0.9 % IJ SOLN
INTRAMUSCULAR | Status: AC
  Filled 2018-03-19: qty 50

## 2018-03-19 MED ORDER — CEPHALEXIN 500 MG PO CAPS: 500.0000 mg | ORAL_CAPSULE | Freq: Four times a day (QID) | ORAL | 0 refills | Status: AC

## 2018-03-19 MED ORDER — OXYCODONE HCL 5 MG PO TABS
5.0000 mg | ORAL_TABLET | ORAL | Status: AC
  Administered 2018-03-19: 5 mg via ORAL
  Filled 2018-03-19: qty 1

## 2018-03-19 MED ORDER — OXYCODONE HCL 5 MG PO TABS: 5.0000 mg | ORAL_TABLET | Freq: Four times a day (QID) | ORAL | 0 refills | Status: DC | PRN

## 2018-03-19 MED ORDER — IOPAMIDOL (ISOVUE-300) INJECTION 61%
100.0000 mL | Freq: Once | INTRAVENOUS | Status: AC | PRN
  Administered 2018-03-19: 100 mL via INTRAVENOUS
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
## 2018-03-19 NOTE — Discharge Instructions (Addendum)
Keflex as prescribed until finished.  We recommend 600 mg ibuprofen every 6 hours for management of pain.  Supplement this with oxycodone as needed.  Call the office of your neurosurgeon in the morning to schedule follow-up within 24 hours.  You may return to the ED for new or concerning symptoms.

## 2018-03-24 LAB — CULTURE, BLOOD (ROUTINE X 2)
Culture: NO GROWTH
Culture: NO GROWTH
Special Requests: ADEQUATE

## 2018-05-18 ENCOUNTER — Other Ambulatory Visit (HOSPITAL_COMMUNITY): Payer: Self-pay | Admitting: Neurological Surgery

## 2018-05-18 DIAGNOSIS — G959 Disease of spinal cord, unspecified: Secondary | ICD-10-CM

## 2018-06-13 ENCOUNTER — Ambulatory Visit (HOSPITAL_COMMUNITY)
Admission: RE | Admit: 2018-06-13 | Discharge: 2018-06-13 | Disposition: A | Discharge status: Home / Self Care | Payer: Medicaid Other | Source: Ambulatory Visit | Attending: Neurological Surgery | Admitting: Neurological Surgery

## 2018-06-13 ENCOUNTER — Other Ambulatory Visit: Payer: Self-pay

## 2018-06-13 DIAGNOSIS — G959 Disease of spinal cord, unspecified: Secondary | ICD-10-CM | POA: Diagnosis present

## 2018-10-02 ENCOUNTER — Emergency Department (HOSPITAL_COMMUNITY)
Admission: EM | Admit: 2018-10-02 | Discharge: 2018-10-02 | Disposition: A | Discharge status: Home / Self Care | Payer: Medicaid Other | Attending: Emergency Medicine | Admitting: Emergency Medicine

## 2018-10-02 ENCOUNTER — Other Ambulatory Visit: Payer: Self-pay

## 2018-10-02 DIAGNOSIS — S022XXD Fracture of nasal bones, subsequent encounter for fracture with routine healing: Secondary | ICD-10-CM | POA: Diagnosis not present

## 2018-10-02 DIAGNOSIS — S52022D Displaced fracture of olecranon process without intraarticular extension of left ulna, subsequent encounter for closed fracture with routine healing: Secondary | ICD-10-CM | POA: Diagnosis not present

## 2018-10-02 DIAGNOSIS — Z79899 Other long term (current) drug therapy: Secondary | ICD-10-CM | POA: Insufficient documentation

## 2018-10-02 DIAGNOSIS — S098XXD Other specified injuries of head, subsequent encounter: Secondary | ICD-10-CM | POA: Diagnosis present

## 2018-10-02 DIAGNOSIS — S52022A Displaced fracture of olecranon process without intraarticular extension of left ulna, initial encounter for closed fracture: Secondary | ICD-10-CM

## 2018-10-02 MED ORDER — OXYCODONE-ACETAMINOPHEN 5-325 MG PO TABS: 1.0000 | ORAL_TABLET | Freq: Four times a day (QID) | ORAL | 0 refills | Status: DC | PRN

## 2018-10-02 MED ORDER — OXYCODONE-ACETAMINOPHEN 5-325 MG PO TABS
1.0000 | ORAL_TABLET | Freq: Once | ORAL | Status: AC
  Administered 2018-10-02: 1 via ORAL
  Filled 2018-10-02: qty 1

## 2018-10-02 MED ORDER — IBUPROFEN 400 MG PO TABS
600.0000 mg | ORAL_TABLET | Freq: Once | ORAL | Status: AC
  Administered 2018-10-02: 600 mg via ORAL
  Filled 2018-10-02: qty 1

## 2018-10-02 NOTE — ED Notes (Signed)
Pt in waiting room trying to figure out ride home. Off duty officer in waiting room and has arranged transportation to get the pt to the bus terminal. Pt outside waiting on ride sitting on bench.

## 2018-10-02 NOTE — Discharge Instructions (Addendum)
Please read attached information. If you experience any new or worsening signs or symptoms please return to the emergency room for evaluation. Please follow-up with your primary care provider or specialist as discussed. Please use medication prescribed only as directed and discontinue taking if you have any concerning signs or symptoms.   °
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
## 2019-03-01 ENCOUNTER — Other Ambulatory Visit: Payer: Self-pay

## 2019-03-01 ENCOUNTER — Emergency Department (HOSPITAL_COMMUNITY): Payer: Medicaid Other

## 2019-03-01 ENCOUNTER — Encounter (HOSPITAL_COMMUNITY): Payer: Self-pay | Admitting: *Deleted

## 2019-03-01 ENCOUNTER — Emergency Department (HOSPITAL_COMMUNITY)
Admission: EM | Admit: 2019-03-01 | Discharge: 2019-03-02 | Disposition: A | Discharge status: Home / Self Care | Payer: Medicaid Other | Attending: Emergency Medicine | Admitting: Emergency Medicine

## 2019-03-01 DIAGNOSIS — Z79899 Other long term (current) drug therapy: Secondary | ICD-10-CM | POA: Insufficient documentation

## 2019-03-01 DIAGNOSIS — R079 Chest pain, unspecified: Secondary | ICD-10-CM

## 2019-03-01 DIAGNOSIS — F1910 Other psychoactive substance abuse, uncomplicated: Secondary | ICD-10-CM | POA: Diagnosis not present

## 2019-03-01 DIAGNOSIS — F1220 Cannabis dependence, uncomplicated: Secondary | ICD-10-CM | POA: Insufficient documentation

## 2019-03-01 DIAGNOSIS — Z046 Encounter for general psychiatric examination, requested by authority: Secondary | ICD-10-CM | POA: Diagnosis present

## 2019-03-01 DIAGNOSIS — F11222 Opioid dependence with intoxication with perceptual disturbance: Secondary | ICD-10-CM | POA: Diagnosis not present

## 2019-03-01 DIAGNOSIS — Z20822 Contact with and (suspected) exposure to covid-19: Secondary | ICD-10-CM | POA: Diagnosis not present

## 2019-03-01 LAB — RAPID URINE DRUG SCREEN, HOSP PERFORMED
Amphetamines: POSITIVE — AB
Barbiturates: NOT DETECTED
Benzodiazepines: NOT DETECTED
Cocaine: NOT DETECTED
Opiates: POSITIVE — AB
Tetrahydrocannabinol: POSITIVE — AB

## 2019-03-01 LAB — COMPREHENSIVE METABOLIC PANEL
ALT: 15 U/L (ref 0–44)
AST: 21 U/L (ref 15–41)
Albumin: 4.7 g/dL (ref 3.5–5.0)
Alkaline Phosphatase: 85 U/L (ref 38–126)
Anion gap: 8 (ref 5–15)
BUN: 15 mg/dL (ref 6–20)
CO2: 26 mmol/L (ref 22–32)
Calcium: 10.1 mg/dL (ref 8.9–10.3)
Chloride: 101 mmol/L (ref 98–111)
Creatinine, Ser: 1.35 mg/dL — ABNORMAL HIGH (ref 0.61–1.24)
GFR calc Af Amer: >60 mL/min (ref >60)
GFR calc non Af Amer: >60 mL/min (ref >60)
Glucose, Bld: 161 mg/dL — ABNORMAL HIGH (ref 70–99)
Potassium: 3.6 mmol/L (ref 3.5–5.1)
Sodium: 135 mmol/L (ref 135–145)
Total Bilirubin: 3.2 mg/dL — ABNORMAL HIGH (ref 0.3–1.2)
Total Protein: 8.2 g/dL — ABNORMAL HIGH (ref 6.5–8.1)

## 2019-03-01 LAB — CBC
HCT: 45.7 % (ref 39.0–52.0)
Hemoglobin: 14.9 g/dL (ref 13.0–17.0)
MCH: 28.4 pg (ref 26.0–34.0)
MCHC: 32.6 g/dL (ref 30.0–36.0)
MCV: 87.2 fL (ref 80.0–100.0)
Platelets: 375 10*3/uL (ref 150–400)
RBC: 5.24 MIL/uL (ref 4.22–5.81)
RDW: 12.7 % (ref 11.5–15.5)
WBC: 11.5 10*3/uL — ABNORMAL HIGH (ref 4.0–10.5)
nRBC: 0 % (ref 0.0–0.2)

## 2019-03-01 LAB — RESPIRATORY PANEL BY RT PCR (FLU A&B, COVID)
Influenza A by PCR: NEGATIVE
Influenza B by PCR: NEGATIVE
SARS Coronavirus 2 by RT PCR: NEGATIVE

## 2019-03-01 LAB — SALICYLATE LEVEL: Salicylate Lvl: <7 mg/dL — ABNORMAL LOW (ref 7.0–30.0)

## 2019-03-01 LAB — ETHANOL: Alcohol, Ethyl (B): <10 mg/dL (ref <10)

## 2019-03-01 LAB — ACETAMINOPHEN LEVEL: Acetaminophen (Tylenol), Serum: <10 ug/mL — ABNORMAL LOW (ref 10–30)

## 2019-03-01 MED ORDER — LORAZEPAM 1 MG PO TABS
1.0000 mg | ORAL_TABLET | Freq: Once | ORAL | Status: AC
  Administered 2019-03-01: 1 mg via ORAL
  Filled 2019-03-01: qty 1

## 2019-03-01 MED ORDER — ACETAMINOPHEN 500 MG PO TABS
1000.0000 mg | ORAL_TABLET | Freq: Once | ORAL | Status: AC
  Administered 2019-03-01: 20:00:00 1000 mg via ORAL
  Filled 2019-03-01: qty 2

## 2019-03-01 NOTE — ED Notes (Signed)
Telepsych machine at bedside ready for reassessment. Pt denies SI or HI at this time.

## 2019-03-01 NOTE — ED Notes (Signed)
ED Provider at bedside. 

## 2019-03-01 NOTE — ED Notes (Signed)
Pt, fairly agitated over the idea of staying. Pt was under the impression he was going to leave.   This nurse explained the meaning of IVC. Pt kept saying "I didn't do nothing wrong, I just want to go home."

## 2019-03-01 NOTE — BH Assessment (Signed)
Tele Assessment Note   Patient Name: Garrett Richardson MRN: [id number] Referring Physician: Dr. Virgina Norfolk, DO Location of Patient: [facility] Location of Provider: Behavioral Health TTS Department  Garrett Richardson is a 44 y.o. male who was brought to [facility] via the [employer] under IVC paperwork that was completed by his wife. According to the paperwork, pt has made comments about "ending it" tonight and throughout the last week. It states pt has been hallucinating, and [employer] reports pt was aggressive and angry at the scene. Pt denies these claims, stating his wife made this information up so she would have a reason to IVC him. Pt acknowledges he smoked marijuana but states he was not hallucinating. He states he caught his wife cheating because their phones are synched and he got messages on his phone that were between her and another man.  Pt denies SI, any prior SI, any attempts, any hospitalizations, or any plan. He denies HI, though he states his wife presses charges against him when she is man and then later drops them. Pt denies AVH, though he shares he o/d on meth 4-5 years ago and he states he experienced AVH at that time. Pt denies NSSIB. Pt states his wife has a gun but that it is currently in police custody because it was in the car when he was pulled over; pt states he has court next month for DWI. Pt acknowledges he used today and yesterday, though he denies regular SA; pt states he smoked marijuana, but he states he thinks something was in the marijuana, as he reacted in a way he never had before. Pt looked at clinician side-eyed, like he was attempting to see clinician's reaction to this statement without clinician noticing he was doing so. Clinician inquired as to what other substances could have been mixed in with the marijuana he smoked, and pt continued to state he didn't know. Clinician looked at pt's UDA, which revealed pt was positive for THC, opioids, and amphetamine. Upon this information, pt  appeared to be confused and stated someone must have added those drugs to his marijuana. Clinician noted that it also could have been that, if he had wanted, pt could have used the opioids to get low and then the amphetamine to get high to balance out the feeling and be expressed that, yes, this was accurate. Clinician asked pt how much opioid he took and pt stated he didn't know. Clinician inquired as to how he took the opioid and he stated that he smoked it. Clinician inquired the same of the meth and he stated that all three drugs were mixed in with the marijuana, and then pt again looked at clinician side-eyed. Clinician moved on with the assessment.  Clinician made contact with pt's wife at 40, as she is who IVCed pt. Pt's wife stated pt has anger issues, though these are worse when pt is on drugs. She states pt hasn't drunk EtOH in a month but that he has "definitely" been using drugs. Pt's wife shared that pt has a hx of using methamphetamine, cocaine, and "whatever he can get his hands on." Pt's wife shared pt got into trouble with drugs and EtOH in November/December 2020 and ended up in jail. She shared that, over the last two days, pt has been jumping in her face, continually sniffing, and hallucinating; she shares that hallucinations are what pt first experiences when he does drugs. Pt's wife shares pt called one of their friends who works for  a crisis response agency over the weekend and threatened the friend that he was going to kill himself. Pt's wife shared that pt was in a drug program in 2019 but that, after two weeks, when the drugs were out of pt's system, the people at the program said there was nothing wrong with pt. She shares that pt was released from prison 7 years ago after being in for 10 years and that he continues to have difficulties to this day acclimating to being out in society.  Pt is oriented x4. His recent and remote memory is intact. Pt was cooperative throughout the  assessment process, though he became teary-eyed at times when discussing how hurt he was by his wife's actions. Pt's insight and judgement is fair at this time; his impulse control is poor.   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 Spondylolysis     Past Surgical History:  Procedure Laterality Date  . POSTERIOR CERVICAL FUSION/FORAMINOTOMY N/A 03/07/2018   Procedure: POSTERIOR CERVICAL LAMINAPLASTY CERVICAL THREE-CERVICAL SIX;  Surgeon: Judith Part, MD;  Location: [facility];  Service: Neurosurgery;  Laterality: N/A;  POSTERIOR CERVICAL LAMINAPLASTY CERVICAL THREE-CERVICAL SIX    Family History: No family history on file.  Social History:  reports current drug use. Drug: Marijuana. No history on file for tobacco and alcohol.  Additional Social History:  Alcohol / Drug Use Pain Medications: Please see MAR Prescriptions: Please see MAR Over the Counter: Please see MAR History of alcohol / drug use?: Yes Longest period of sobriety (when/how long): Unknown Negative Consequences of Use: Legal, Personal relationships Substance #1 Name of Substance 1: Marijuana 1 - Age of First Use: 14 1 - Amount (size/oz): .5 1 - Frequency: Several times/week 1 - Duration: Unknown 1 - Last Use / Amount: 03/01/2019 Substance #2 Name of Substance 2: Methamphetamine 2 - Age of First Use: Unknown 2 - Amount (size/oz): Unknown 2 - Frequency: Unknown 2 - Duration: Unknown 2 - Last Use / Amount: 1-2 days ago; hx of abuse 4-5 years ago Substance #3 Name of Substance 3: Opioids 3 - Age of First Use: Unknown 3 - Amount (size/oz): 1 pt 3 - Frequency: Unknown 3 - Duration: Unknown 3 - Last Use / Amount: 02/28/2019 Substance #4 Name of Substance 4: Cocaine 4 - Age of First Use: Unknown 4 - Amount (size/oz): Unknown 4 - Frequency: Unknown 4  - Duration: Unknown 4 - Last Use / Amount: Unknown (pt denies use; pt's wife shared pt's hx) Substance #5 Name of Substance 5: EtOH 5 - Age of First Use: Unknown 5 - Amount (size/oz): Unknown 5 - Frequency: Unknown 5 - Duration: Unknown 5 - Last Use / Amount: Unknown (Pt denies use, though pt was in jail for EtOH in Nov/Dec 2020 & has court for a DWI next month)  CIWA: CIWA-Ar BP: 116/75 Pulse Rate: (!) 110 Nausea and Vomiting: no nausea and no vomiting Tactile Disturbances: none Tremor: no tremor Auditory Disturbances: not present Paroxysmal Sweats: no sweat visible Visual Disturbances: not present Anxiety: no anxiety, at ease Headache, Fullness in Head: none present Agitation: normal activity Orientation and Clouding of Sensorium: oriented and can do serial additions CIWA-Ar Total: 0 COWS:    Allergies:  Allergies  Allergen Reactions  . Morphine And Related Itching    Tolerates Dilaudid and PO oxycodone  . Leona  Medications: (Not in a hospital admission)   OB/GYN Status:  No LMP for male patient.  General Assessment Data Location of Assessment: [facility] ED TTS Assessment: In system Is this a Tele or Face-to-Face Assessment?: Tele Assessment Is this an Initial Assessment or a Re-assessment for this encounter?: Initial Assessment Patient Accompanied by:: N/A Language Other than English: No Living Arrangements: Other (Comment)(Pt lives with his wife & teenage daughter) What gender do you identify as?: Male Marital status: Married Living Arrangements: Children, Spouse/significant other Can pt return to current living arrangement?: Yes Admission Status: Involuntary Petitioner: Family member Is patient capable of signing voluntary admission?: Yes Referral Source: Self/Family/Friend Insurance type: Medicaid [place]     Crisis Care Plan Living Arrangements: Children, Spouse/significant other Legal Guardian: Other:(Self) Name of Psychiatrist: None Name of  Therapist: None  Education Status Is patient currently in school?: No Is the patient employed, unemployed or receiving disability?: Unemployed(Seeking to file Disability)  Risk to self with the past 6 months Suicidal Ideation: No Has patient been a risk to self within the past 6 months prior to admission? : Yes Suicidal Intent: No Has patient had any suicidal intent within the past 6 months prior to admission? : No Is patient at risk for suicide?: No Suicidal Plan?: No Has patient had any suicidal plan within the past 6 months prior to admission? : No Access to Means: No What has been your use of drugs/alcohol within the last 12 months?: Pt acknowledges marijuana use; his wife shares pt uses any substances, and pt's UDA was positive for THC, opioids, and amphetamine Previous Attempts/Gestures: No How many times?: 0 Other Self Harm Risks: Pt is in denial re: his SA Triggers for Past Attempts: None known Intentional Self Injurious Behavior: None Family Suicide History: No Recent stressful life event(s): Conflict (Comment), Legal Issues(Pt has been experiencing conflict w/ his wife) Persecutory voices/beliefs?: No Depression: Yes Depression Symptoms: Despondent, Tearfulness, Guilt, Feeling worthless/self pity, Feeling angry/irritable Substance abuse history and/or treatment for substance abuse?: Yes Suicide prevention information given to non-admitted patients: Not applicable  Risk to Others within the past 6 months Homicidal Ideation: No Does patient have any lifetime risk of violence toward others beyond the six months prior to admission? : No Thoughts of Harm to Others: No Current Homicidal Intent: No Current Homicidal Plan: No Access to Homicidal Means: No Identified Victim: None noted History of harm to others?: No Assessment of Violence: None Noted Violent Behavior Description: None noted Does patient have access to weapons?: No(Pt denied access to guns/weapons, stating the  [employer]A took it) Criminal Charges Pending?: Yes Describe Pending Criminal Charges: DWI Does patient have a court date: Yes Court Date: 03/24/18 Is patient on probation?: Yes  Psychosis Hallucinations: Auditory, Visual Delusions: None noted  Mental Status Report Appearance/Hygiene: In scrubs Eye Contact: Good Motor Activity: Unremarkable Speech: Logical/coherent Level of Consciousness: Alert Mood: Depressed, Anxious Affect: Appropriate to circumstance Anxiety Level: Minimal Thought Processes: Circumstantial Judgement: Impaired Orientation: Person, Place, Time, Situation Obsessive Compulsive Thoughts/Behaviors: Minimal  Cognitive Functioning Concentration: Good Memory: Recent Intact, Remote Intact Is patient IDD: No Insight: Fair Impulse Control: Poor Appetite: Good Have you had any weight changes? : No Change Sleep: No Change Total Hours of Sleep: 6 Vegetative Symptoms: None  ADLScreening [facility] Assessment Services) Patient's cognitive ability adequate to safely complete daily activities?: Yes Patient able to express need for assistance with ADLs?: Yes Independently performs ADLs?: Yes (appropriate for developmental age)  Prior Inpatient Therapy Prior Inpatient Therapy: No  Prior Outpatient Therapy  Prior Outpatient Therapy: Yes Prior Therapy Dates: 5 years ago Prior Therapy Facilty/Provider(s): [facility] Reason for Treatment: Pt cannot remember Does patient have an ACCT team?: No Does patient have Intensive In-House Services?  : No Does patient have [facility] services? : No Does patient have P4CC services?: No  ADL Screening (condition at time of admission) Patient's cognitive ability adequate to safely complete daily activities?: Yes Is the patient deaf or have difficulty hearing?: No Does the patient have difficulty seeing, even when wearing glasses/contacts?: No Does the patient have difficulty concentrating, remembering, or making decisions?:  No Patient able to express need for assistance with ADLs?: Yes Does the patient have difficulty dressing or bathing?: No Independently performs ADLs?: Yes (appropriate for developmental age) Does the patient have difficulty walking or climbing stairs?: No Weakness of Legs: None Weakness of Arms/Hands: None  Home Assistive Devices/Equipment Home Assistive Devices/Equipment: None  Therapy Consults (therapy consults require a physician order) PT Evaluation Needed: No OT Evalulation Needed: No SLP Evaluation Needed: No Abuse/Neglect Assessment (Assessment to be complete while patient is alone) Abuse/Neglect Assessment Can Be Completed: Yes Physical Abuse: Denies Verbal Abuse: Denies Sexual Abuse: Denies Exploitation of patient/patient's resources: Denies Self-Neglect: Denies Values / Beliefs Cultural Requests During Hospitalization: None Spiritual Requests During Hospitalization: None Consults Spiritual Care Consult Needed: No Transition of Care Team Consult Needed: No Advance Directives (For Healthcare) Does Patient Have a Medical Advance Directive?: No Would patient like information on creating a medical advance directive?: No - Patient declined          Disposition: Berneice Heinrich, NP, reviewed pt's chart and information and determined pt should be observed overnight for safety and stability and re-assessed in the morning. This information was provided to pt's nurse, Michele Mcalpine RN, at [phone].   Disposition Initial Assessment Completed for this Encounter: Yes Patient referred to: Other (Comment)(Pt will be observed overnight for safety and stability)  This service was provided via telemedicine using a 2-way, interactive audio and video technology.  Names of all persons participating in this telemedicine service and their role in this encounter. Name: Axle Parfait Role: Patient  Name: Sherron Flemings Role: Patient's Wife  Name: Berneice Heinrich Role: Nurse Practitioner  Name: Duard Brady  Role: Clinician    Ralph Dowdy 03/01/2019 6:36 PM

## 2019-03-01 NOTE — ED Triage Notes (Signed)
Pt arrives ambulatory via [employer] under IVC. IVC papers stating the patient has made comments of "ending it" tonight and has made the same comments through the week to his wife. Papers report the pt has also been hallucinating. [employer] reporting at the scene the pt was aggressive and angry.   Pt is denying SI or HI on triage assessment. He is calm and cooperative. Denies psych history, hx of suicide or taking any psychatric meds. Plan explained to the patient. Belongings removed, pt changed into scrubs, requested security to wand the patient. Labs/urine/covid swab completed.

## 2019-03-01 NOTE — ED Provider Notes (Signed)
[facility] EMERGENCY DEPARTMENT Provider Note   CSN: [id number] Arrival date & time: 03/01/19  0617     History Chief Complaint  Patient presents with  . Medical Clearance    Garrett Richardson is a 44 y.o. male who presents for evaluation for medical clearance. Pt is under IVC by his wife. He states that he had neck surgery a couple months ago and she had brain surgery and all he's been doing is taking care of her. Over the past 3 days he recently found out that she has been cheating on him with another man because their phones are linked and he can see her conversations. The IVC paperwork reports he has been making suicidal comments of "ending it" and he has been hallucinating. The patient adamantly denies this. He states that he has smoked some marijuana and uses opioids for his chronic neck pain but denies any other drug use. He does have a prior history of polysubstance abuse (meth, ETOH) but states he hasn't done this in a long time since he has been taking care of his wife. He adamantly denies SI/HI, or AVH. He reports prior mental health admission in [facility] which he attributes to his meth use. He reports some upper chest pain which he attributes to his marijuana use. This started last night. It's constant and non-radiating. He states it's mild in nature.  HPI     Past Medical History:  Diagnosis Date  . Spondylolysis     Patient Active Problem List   Diagnosis Date Noted  . Spinal cord injury, cervical region [facility]) 03/06/2018    Past Surgical History:  Procedure Laterality Date  . POSTERIOR CERVICAL FUSION/FORAMINOTOMY N/A 03/07/2018   Procedure: POSTERIOR CERVICAL LAMINAPLASTY CERVICAL THREE-CERVICAL SIX;  Surgeon: [name], MD;  Location: [facility] OR;  Service: Neurosurgery;  Laterality: N/A;  POSTERIOR CERVICAL LAMINAPLASTY CERVICAL THREE-CERVICAL SIX       No family history on file.  Social History   Tobacco Use  . Smoking status: Not on  file  Substance Use Topics  . Alcohol use: Not on file  . Drug use: Not on file    Home Medications Prior to Admission medications   Medication Sig Start Date End Date Taking? Authorizing Provider  cyclobenzaprine (FLEXERIL) 10 MG tablet Take 1 tablet (10 mg total) by mouth 3 (three) times daily as needed for muscle spasms. 03/08/18   [name], MD  docusate sodium (COLACE) 100 MG capsule Take 1 capsule (100 mg total) by mouth 2 (two) times daily. 03/08/18   [name], MD  oxyCODONE (OXY IR/ROXICODONE) 5 MG immediate release tablet Take 1 tablet (5 mg total) by mouth every 6 (six) hours as needed for moderate pain or severe pain (pain). 03/19/18   Antony Madura, PA-C  oxyCODONE-acetaminophen (PERCOCET/ROXICET) 5-325 MG tablet Take 1 tablet by mouth every 6 (six) hours as needed. 10/02/18   Hedges, Tinnie Gens, PA-C    Allergies    Morphine and related and Banana  Review of Systems   Review of Systems  Constitutional: Negative for chills and fever.  Respiratory: Negative for shortness of breath.   Cardiovascular: Positive for chest pain.  Gastrointestinal: Negative for abdominal pain.  Neurological: Negative for headaches.  All other systems reviewed and are negative.   Physical Exam Updated Vital Signs BP (!) 133/94 (BP Location: Right Arm)   Pulse (!) 120   Temp 99.2 F (37.3 C) (Oral)   Resp 16   SpO2 99%  Physical Exam Vitals and nursing note reviewed.  Constitutional:      General: He is not in acute distress.    Appearance: Normal appearance. He is well-developed. He is not ill-appearing.     Comments: Alert, cooperative. Clear mental status  HENT:     Head: Normocephalic and atraumatic.  Eyes:     General: No scleral icterus.       Right eye: No discharge.        Left eye: No discharge.     Conjunctiva/sclera: Conjunctivae normal.     Pupils: Pupils are equal, round, and reactive to light.  Cardiovascular:     Rate and Rhythm: Regular rhythm.  Tachycardia present.  Pulmonary:     Effort: Pulmonary effort is normal. No respiratory distress.     Breath sounds: Normal breath sounds.  Abdominal:     General: There is no distension.     Palpations: Abdomen is soft.     Tenderness: There is no abdominal tenderness.  Musculoskeletal:     Cervical back: Normal range of motion.  Skin:    General: Skin is warm and dry.  Neurological:     Mental Status: He is alert and oriented to person, place, and time.  Psychiatric:        Attention and Perception: Attention normal. He does not perceive auditory or visual hallucinations.        Mood and Affect: Mood normal.        Speech: Speech normal.        Behavior: Behavior normal. Behavior is cooperative.        Thought Content: Thought content is not paranoid or delusional. Thought content does not include homicidal or suicidal ideation. Thought content does not include homicidal or suicidal plan.        Cognition and Memory: Cognition normal.        Judgment: Judgment normal.     ED Results / Procedures / Treatments   Labs (all labs ordered are listed, but only abnormal results are displayed) Labs Reviewed  COMPREHENSIVE METABOLIC PANEL - Abnormal; Notable for the following components:      Result Value   Glucose, Bld 161 (*)    Creatinine, Ser 1.35 (*)    Total Protein 8.2 (*)    Total Bilirubin 3.2 (*)    All other components within normal limits  SALICYLATE LEVEL - Abnormal; Notable for the following components:   Salicylate Lvl <3.2 (*)    All other components within normal limits  ACETAMINOPHEN LEVEL - Abnormal; Notable for the following components:   Acetaminophen (Tylenol), Serum <10 (*)    All other components within normal limits  CBC - Abnormal; Notable for the following components:   WBC 11.5 (*)    All other components within normal limits  RAPID URINE DRUG SCREEN, HOSP PERFORMED - Abnormal; Notable for the following components:   Opiates POSITIVE (*)     Amphetamines POSITIVE (*)    Tetrahydrocannabinol POSITIVE (*)    All other components within normal limits  RESPIRATORY PANEL BY RT PCR (FLU A&B, COVID)  ETHANOL    EKG EKG Interpretation  Date/Time:  Friday March 01 2019 07:09:27 EST Ventricular Rate:  104 PR Interval:    QRS Duration: 74 QT Interval:  311 QTC Calculation: 409 R Axis:   85 Text Interpretation: Sinus tachycardia Borderline T wave abnormalities When compared with ECG of 03/06/2018, Nonspecific T wave abnormality is now present Confirmed by Delora Fuel ([id number]) on 03/01/2019 7:15:42 AM  Radiology DG Chest Port 1 View  Result Date: 03/01/2019 CLINICAL DATA:  Altered mental status. EXAM: PORTABLE CHEST 1 VIEW COMPARISON:  Single-view of the chest 02/24/2018. FINDINGS: The lungs are clear. Heart size is normal. No pneumothorax or pleural fluid. No bony abnormality. IMPRESSION: Normal chest. Electronically Signed   By: Drusilla Kanner M.D.   On: 03/01/2019 07:26    Procedures Procedures (including critical care time)  Medications Ordered in ED Medications - No data to display  ED Course  I have reviewed the triage vital signs and the nursing notes.  Pertinent labs & imaging results that were available during my care of the patient were reviewed by me and considered in my medical decision making (see chart for details).  44 year old male presents for medical clearance under IVC for suicidal statements to his wife and hallucination. Pt adamantly denies these claims and states that he's been upset because she has been cheating on him. He admits to drug use with marijuana. He uses opioids as well and states that he is prescribed this medicine for his neck. History is not totally consistent with drug screen as he tested positive for amphetamines as well despite telling me he hasn't used in a long time. Will obtain medical clearance labs and add on EKG and CXR for chest pain although sounds atypical.  CBC is remarkable  for mild leukocytosis (11) which is likely reactive from drug use. CMP is remarkable for mild elevation of Scr. He has had similar elevations before. EKG is sinus tachycardia. CXR is clear. Will recheck vitals.  HR is improved. Pt medically cleared for TTS eval.  MDM Rules/Calculators/A&P                       Final Clinical Impression(s) / ED Diagnoses Final diagnoses:  Chest pain  Polysubstance abuse [facility])    Rx / DC Orders ED Discharge Orders    None       Bethel Born, PA-C 03/01/19 0911    Virgina Norfolk, DO 03/01/19 [id number]

## 2019-03-01 NOTE — ED Notes (Signed)
Patient given Snack and Drink. 

## 2019-03-01 NOTE — ED Notes (Signed)
Called [facility] for TTS. Pt updated about wait time.

## 2019-03-01 NOTE — ED Notes (Signed)
Diet was ordered for Lunch. 

## 2019-03-02 NOTE — ED Provider Notes (Signed)
Psychiatry is recommending discharge.  Patient is not suicidal, homicidal, or psychotic.  Involuntary commitment reversed.  Appears stable for discharge with outpatient follow-up.   Pricilla Loveless, MD 03/02/19 1011

## 2019-03-02 NOTE — ED Notes (Signed)
States he found out his spouse was cheating on him and when he confronted her, she told law enforcement he was SI. Pt noted to be calm, cooperative. Denies SI/HI.

## 2019-03-02 NOTE — ED Notes (Signed)
Spouse called asking info re: pt. Advised her pt is sleeping and will notify him of her call when awakens. Advised unable to provide info w/o pt's permission.

## 2019-03-02 NOTE — ED Notes (Signed)
Pt noted to be visibly frustrated, advises " I will be leaving at 0700, they better evaluate me soon". Pt was redirectable, no indication of violence towards staff. Guided back to bed by sitter.

## 2019-03-02 NOTE — ED Notes (Signed)
Telepsych being performed. 

## 2019-03-02 NOTE — Progress Notes (Addendum)
Patient ID: Garrett Richardson, male   DOB: 1975/06/30, 44 y.o.   MRN: [id number]  Reassessment  Garrett Richardson is a 44 y.o. male who was brought to [facility] via the [employer] under IVC paperwork that was completed by his wife. According to the paperwork, pt has made comments about "ending it" tonight and throughout the last week. It states pt has been hallucinating, and [employer] reports pt was aggressive and angry at the scene. Pt denies these claims, stating his wife made this information up so she would have a reason to IVC him. Pt acknowledges he smoked marijuana but states he was not hallucinating. He states he caught his wife cheating because their phones are synched and he got messages on his phone that were between her and another man.  (See counselors note for additional information)   Psychiatric Evaluation: This is a 44 year old male who presented to the ED for concerns as noted above. During this evaluation, he is alert and oriented x4, calm and cooperative. He reports he is in the ED because his wife IVC'd him. He reports two days ago, he found out that his wife was cheating on him. He reports he confronted his wife and claims that later the police showed up at their residence. He reports the police left and when they left, his wife also left. Reports about an hour later, the police showed back up and stated that they were taking him to the hospital. He denies that he made threats to harm himself and currently denies SI, HI or psychosis. He admits that he had been clean for using drugs but after the incident, he, " snorted" what he thought was crack cocaine. He reports he was told that his UDS was positive for meth although denies recent use of meth. He reports that he has been to rehab in the past for his substance abuse. He seems to be minimizing his substance use. He denies prior psychiatric history. Denies access to firearms. Reports he does not feel that he need substance abuse treatment although he would  like resources for outpatient therapy for individually and counseling for his marriage.   Disposition: Patient denies SI, HI or psychosis. His issues appear to be mostly substance related. There is no evidence of imminent risk to self or others at present.  Patient does not meet criteria for psychiatric inpatient admission as such, he is psychiatrically cleared. Will have social worker fax over resources for substance abuse and outpatient therapy.     ED nurse Lurena Joiner updated on current disposition.    Attest to NP Note

## 2019-03-02 NOTE — ED Notes (Signed)
IVC paperwork rescinded by Dr Criss Alvine - Copy faxed to [facility] - Copy sent to Medical Records - Original placed in folder for [employer]. ALL belongings - 3 labeled bags and 1 Valuables Envelope - returned to pt - Pt signed verifying all items present.

## 2019-03-02 NOTE — ED Notes (Signed)
Breakfast ordered 

## 2019-03-02 NOTE — Discharge Instructions (Addendum)
If you develop recurrent, continued, or worsening chest pain, shortness of breath, fever, vomiting, abdominal or back pain, or any other new/concerning symptoms then return to the ER for evaluation.  

## 2019-04-14 ENCOUNTER — Emergency Department (HOSPITAL_COMMUNITY)
Admission: EM | Admit: 2019-04-14 | Discharge: 2019-04-14 | Disposition: A | Discharge status: Home / Self Care | Payer: Medicaid Other | Attending: Emergency Medicine | Admitting: Emergency Medicine

## 2019-04-14 ENCOUNTER — Encounter (HOSPITAL_COMMUNITY): Payer: Self-pay

## 2019-04-14 ENCOUNTER — Emergency Department (HOSPITAL_COMMUNITY): Payer: Medicaid Other

## 2019-04-14 ENCOUNTER — Other Ambulatory Visit: Payer: Self-pay

## 2019-04-14 DIAGNOSIS — S6991XA Unspecified injury of right wrist, hand and finger(s), initial encounter: Secondary | ICD-10-CM | POA: Insufficient documentation

## 2019-04-14 DIAGNOSIS — Y939 Activity, unspecified: Secondary | ICD-10-CM | POA: Insufficient documentation

## 2019-04-14 DIAGNOSIS — Y999 Unspecified external cause status: Secondary | ICD-10-CM | POA: Insufficient documentation

## 2019-04-14 DIAGNOSIS — W208XXA Other cause of strike by thrown, projected or falling object, initial encounter: Secondary | ICD-10-CM | POA: Diagnosis not present

## 2019-04-14 DIAGNOSIS — F17210 Nicotine dependence, cigarettes, uncomplicated: Secondary | ICD-10-CM | POA: Diagnosis not present

## 2019-04-14 DIAGNOSIS — Z23 Encounter for immunization: Secondary | ICD-10-CM | POA: Diagnosis not present

## 2019-04-14 DIAGNOSIS — Y92009 Unspecified place in unspecified non-institutional (private) residence as the place of occurrence of the external cause: Secondary | ICD-10-CM | POA: Diagnosis not present

## 2019-04-14 MED ORDER — TETANUS-DIPHTH-ACELL PERTUSSIS 5-2.5-18.5 LF-MCG/0.5 IM SUSP
0.5000 mL | Freq: Once | INTRAMUSCULAR | Status: AC
  Administered 2019-04-14: 0.5 mL via INTRAMUSCULAR
  Filled 2019-04-14: qty 0.5

## 2019-04-14 MED ORDER — NAPROXEN 500 MG PO TABS: 500.0000 mg | ORAL_TABLET | Freq: Two times a day (BID) | ORAL | 0 refills | Status: AC

## 2019-04-14 NOTE — ED Provider Notes (Signed)
[place] DEPT Provider Note   CSN: [id number] Arrival date & time: 04/14/19  1429     History Chief Complaint  Patient presents with  . Hand Injury    Garrett Richardson is a 44 y.o. male.  44 y.o male with no PMH presents to the ED with a chief complaint of right hand pain s/p injury. Patient was at home when a tree struck his house and had part of the roof come down onto his right hand. He reports pain along the dorsum aspect of the foot, worse with hand flexion and extension. He has applied ice to the area without improvement in symptoms. He denies any tingling, weakness or other complaints.Does have a 0.5 cm skin tear to the dorsum aspect of the right hand, unknown on last tetanus vaccine.   The history is provided by the patient and medical records.  Hand Injury Associated symptoms: no fever        Past Medical History:  Diagnosis Date  . Spondylolysis     Patient Active Problem List   Diagnosis Date Noted  . Spinal cord injury, cervical region [facility]) 03/06/2018    Past Surgical History:  Procedure Laterality Date  . POSTERIOR CERVICAL FUSION/FORAMINOTOMY N/A 03/07/2018   Procedure: POSTERIOR CERVICAL LAMINAPLASTY CERVICAL THREE-CERVICAL SIX;  Surgeon: Judith Part, MD;  Location: [place];  Service: Neurosurgery;  Laterality: N/A;  POSTERIOR CERVICAL LAMINAPLASTY CERVICAL THREE-CERVICAL SIX       Family History  Problem Relation Age of Onset  . Diabetes Mother     Social History   Tobacco Use  . Smoking status: Current Every Day Smoker    Packs/day: 2.00    Types: Cigarettes  . Smokeless tobacco: Current User  Substance Use Topics  . Alcohol use: Never  . Drug use: Not Currently    Types: Marijuana    Home Medications Prior to Admission medications   Medication Sig Start Date End Date Taking? Authorizing Provider  cyclobenzaprine (FLEXERIL) 10 MG tablet Take 1 tablet (10 mg total) by mouth 3 (three) times daily as  needed for muscle spasms. Patient not taking: Reported on 03/01/2019 03/08/18   Judith Part, MD  docusate sodium (COLACE) 100 MG capsule Take 1 capsule (100 mg total) by mouth 2 (two) times daily. Patient not taking: Reported on 03/01/2019 03/08/18   Judith Part, MD  ibuprofen (ADVIL) 200 MG tablet Take 200 mg by mouth every 6 (six) hours as needed for fever, headache or moderate pain.    [provider]  naproxen (NAPROSYN) 500 MG tablet Take 1 tablet (500 mg total) by mouth 2 (two) times daily for 7 days. 04/14/19 04/21/19  Janeece Fitting, PA-C  oxyCODONE (OXY IR/ROXICODONE) 5 MG immediate release tablet Take 1 tablet (5 mg total) by mouth every 6 (six) hours as needed for moderate pain or severe pain (pain). Patient not taking: Reported on 03/01/2019 03/19/18   Antonietta Breach, PA-C  oxyCODONE-acetaminophen (PERCOCET/ROXICET) 5-325 MG tablet Take 1 tablet by mouth every 6 (six) hours as needed. Patient taking differently: Take 1 tablet by mouth every 6 (six) hours as needed for moderate pain.  10/02/18   Hedges, Dellis Filbert, PA-C    Allergies    Morphine and related, Banana, and Bee venom  Review of Systems   Review of Systems  Constitutional: Negative for fever.  Musculoskeletal: Positive for arthralgias and myalgias. Negative for joint swelling.  Skin: Positive for wound.    Physical Exam Updated Vital Signs BP  123/83 (BP Location: Left Arm)   Pulse 77   Temp 97.7 F (36.5 C) (Oral)   Resp 16   Ht 5\' 7"  (1.702 m)   Wt 72.6 kg   SpO2 100%   BMI 25.06 kg/m   Physical Exam Vitals and nursing note reviewed.  Constitutional:      Appearance: He is well-developed.  HENT:     Head: Normocephalic and atraumatic.  Eyes:     General: No scleral icterus.    Pupils: Pupils are equal, round, and reactive to light.  Cardiovascular:     Heart sounds: Normal heart sounds.  Pulmonary:     Effort: Pulmonary effort is normal.     Breath sounds: Normal breath sounds. No wheezing.   Chest:     Chest wall: No tenderness.  Abdominal:     General: Bowel sounds are normal. There is no distension.     Palpations: Abdomen is soft.     Tenderness: There is no abdominal tenderness.  Musculoskeletal:        General: No deformity.     Right hand: Swelling, tenderness and bony tenderness present. No deformity. Normal range of motion. Normal strength. Normal sensation. There is no disruption of two-point discrimination. Normal capillary refill. Normal pulse.     Cervical back: Normal range of motion.     Comments: Tenderness to palpation along the dorsum aspect of the hand, pulses are present, capillary refill is intact.  Full range of motion with pain.  Sensation is intact.  Small 0.5 cm skin tear to dorsum aspect. No snuff box tenderness.   Skin:    General: Skin is warm and dry.  Neurological:     Mental Status: He is alert and oriented to person, place, and time.     ED Results / Procedures / Treatments   Labs (all labs ordered are listed, but only abnormal results are displayed) Labs Reviewed - No data to display  EKG None  Radiology DG Hand Complete Right  Result Date: 04/14/2019 CLINICAL DATA:  Hand pain with injury removing items from his house after the roof collapsed. EXAM: RIGHT HAND - COMPLETE 3+ VIEW COMPARISON:  None. FINDINGS: There is no evidence of fracture or dislocation. A 5 mm radiopacity in the palmar soft tissues of the hand near the fifth metacarpal may represent a calcification or radiopaque foreign body. There is no evidence of arthropathy or other focal bone abnormality. IMPRESSION: No acute fracture or dislocation. A 5 mm radiopacity in the palmar soft tissues of the hand near the fifth metacarpal may represent a calcification or radiopaque foreign body. Electronically Signed   By: 04/16/2019 M.D.   On: 04/14/2019 15:14    Procedures Procedures (including critical care time)  Medications Ordered in ED Medications  Tdap (BOOSTRIX)  injection 0.5 mL (0.5 mLs Intramuscular Given 04/14/19 1544)    ED Course  I have reviewed the triage vital signs and the nursing notes.  Pertinent labs & imaging results that were available during my care of the patient were reviewed by me and considered in my medical decision making (see chart for details).    MDM Rules/Calculators/A&P     Patient with no pertinent past medical history presents to the ED status post right hand injury, when he had part of his rib collapsed on his right hand.  There is pain with flexion and extension but range of motion is adequate.  There is a small laceration to the dorsum aspect of the hand,  no prior tetanus vaccine on file.  He does report pain with flexion and extension.  Right hand does appear swollen.  X-ray of the right hand showed: No acute fracture or dislocation. A 5 mm radiopacity in the palmar  soft tissues of the hand near the fifth metacarpal may represent a  calcification or radiopaque foreign body.    Interpretation of  His  labs do not show any fracture, dislocation, acute injury.  His right hand will be placed in a Velcro splint, he was also updated with a tetanus vaccine.  Patient will go home on a short prescription of anti-inflammatories to help with his symptoms.  Patient nurses and agrees to management, return precautions discussed at length.   Portions of this note were generated with Scientist, clinical (histocompatibility and immunogenetics). Dictation errors may occur despite best attempts at proofreading.  Final Clinical Impression(s) / ED Diagnoses Final diagnoses:  Injury of right hand, initial encounter    Rx / DC Orders ED Discharge Orders         Ordered    naproxen (NAPROSYN) 500 MG tablet  2 times daily     04/14/19 1542           Claude Manges, PA-C 04/14/19 1545    Sabas Sous, MD 04/14/19 [phone]

## 2019-04-14 NOTE — Discharge Instructions (Addendum)
We updated your tetanus vaccine on today's visit.  Your x-ray did not show any fracture, dislocation.  I provided a short prescription for anti-inflammatories to help with your pain.  Continue to apply ice, elevate along with rest your right hand.

## 2019-04-14 NOTE — ED Triage Notes (Signed)
Patient states he was trying to move a tree that had fallen onto his house and the fell and landed on his right hand.

## 2019-06-08 ENCOUNTER — Observation Stay (HOSPITAL_COMMUNITY)
Admission: RE | Admit: 2019-06-08 | Discharge: 2019-06-09 | Disposition: A | Discharge status: Home / Self Care | Payer: Medicaid Other | Attending: Psychiatry | Admitting: Psychiatry

## 2019-06-08 ENCOUNTER — Other Ambulatory Visit: Payer: Self-pay

## 2019-06-08 ENCOUNTER — Encounter (HOSPITAL_COMMUNITY): Payer: Self-pay | Admitting: Psychiatry

## 2019-06-08 DIAGNOSIS — F15980 Other stimulant use, unspecified with stimulant-induced anxiety disorder: Secondary | ICD-10-CM | POA: Diagnosis present

## 2019-06-08 DIAGNOSIS — F14980 Cocaine use, unspecified with cocaine-induced anxiety disorder: Principal | ICD-10-CM | POA: Insufficient documentation

## 2019-06-08 DIAGNOSIS — Z20822 Contact with and (suspected) exposure to covid-19: Secondary | ICD-10-CM | POA: Diagnosis present

## 2019-06-08 MED ORDER — TRAZODONE HCL 50 MG PO TABS: 50.0000 mg | ORAL_TABLET | Freq: Every evening | ORAL | Status: DC | PRN

## 2019-06-08 MED ORDER — ACETAMINOPHEN 325 MG PO TABS: 650.0000 mg | ORAL_TABLET | Freq: Four times a day (QID) | ORAL | Status: DC | PRN

## 2019-06-08 MED ORDER — HYDROXYZINE HCL 25 MG PO TABS: 25.0000 mg | ORAL_TABLET | Freq: Three times a day (TID) | ORAL | Status: DC | PRN

## 2019-06-08 MED ORDER — MAGNESIUM HYDROXIDE 400 MG/5ML PO SUSP: 30.0000 mL | Freq: Every day | ORAL | Status: DC | PRN

## 2019-06-08 MED ORDER — ALUM & MAG HYDROXIDE-SIMETH 200-200-20 MG/5ML PO SUSP: 30.0000 mL | ORAL | Status: DC | PRN

## 2019-06-08 NOTE — BH Assessment (Signed)
Assessment Note  Garrett Richardson is an 44 y.o. male.  -Clinician reviewed the IVC papers on patient.  Wife took out IVC.  Paperwork alleges that patient has been hostile & aggressive towards other, is having hallucinations; that he has not been eating, sleeping or tending t hygiene and that hs is using drugs & ETOH.  Patient says that he has caught his wife cheating on him again.  He says he linked their phones and found out she has been cheating on him.  He referenced that this happened before in January. Pt was assessed on 03-01-19.  Patient adamantly denies any SI.  He reports no previous plan or intention or attempt to kill himself.  Pt also denies any HI or A/V hallucinations.  Clinician asked if he had thoughts that people were at the door or trying to get at him and he denies those thoughts.  He does admit to using cocaine tonight.  He said he was by himself at the home all day while wife and daughter were in [place].    Pt has good eye contact.  He reports getting around 6 hours of sleep a night and having a good appetite.  Pt appears anxious and speaks rapidly.  He did ingest cocaine earlier today.  Pt is not responding to internal stimuli.  Pt thought process is clear and coherent.  Clinician contacted petitioner.  She said that patient has been using drugs again.  She says that when he is on drugs he becomes a different person.  He is unpredictable in that he may be loving and caring one day and the next be mean and cursing her.  She reports that patient will accuse her of cheating on him.  He will think someone is watching them or in the back yard or at the door.  Petitioner also said that patient does have two DUIs and has a court date on 06/26/19.  She said that he also has a Ship broker for having a firearm and being a felon.    Petitioner said that the last time he "fooled the doctors" and she wants to make sure that this does no happen again.  She said patient is good at  manipulating people.  -Pt was seen by Nira Conn, FNP who recommends overnight observation and for psychiatry to review IVC papers.  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 Spondylolysis     Past Surgical History:  Procedure Laterality Date  . BACK SURGERY     neck  . POSTERIOR CERVICAL FUSION/FORAMINOTOMY N/A 03/07/2018   Procedure: POSTERIOR CERVICAL LAMINAPLASTY CERVICAL THREE-CERVICAL SIX;  Surgeon: [name], MD;  Location: [facility] OR;  Service: Neurosurgery;  Laterality: N/A;  POSTERIOR CERVICAL LAMINAPLASTY CERVICAL THREE-CERVICAL SIX    Family History:  Family History  Problem Relation Age of Onset  . Diabetes Mother     Social History:  reports that he has been smoking cigarettes. He has been smoking about 2.00 packs per day. He uses smokeless tobacco. He reports previous alcohol use. He reports current drug use. Drug: Other-see comments.  Additional Social History:  Alcohol / Drug Use Pain Medications: None Prescriptions: None Over the Counter: None History of alcohol / drug use?: Yes Substance #1 Name of Substance 1: Cocaine 1 - Age of First Use: Teens 1 - Amount (size/oz): Varies 1 - Frequency: Says that he only used it today 1 - Duration: off and on  1 - Last Use / Amount: 04/17  CIWA:   COWS:    Allergies:  Allergies  Allergen Reactions  . Morphine And Related Itching    Tolerates Dilaudid and PO oxycodone  . Banana   . Bee Venom     Home Medications:  Medications Prior to Admission  Medication Sig Dispense Refill  . cyclobenzaprine (FLEXERIL) 10 MG tablet Take 1 tablet (10 mg total) by mouth 3 (three) times daily as needed for muscle spasms. (Patient not taking: Reported on 03/01/2019) 30 tablet 2  . docusate sodium (COLACE) 100 MG capsule Take 1 capsule (100 mg total) by mouth 2 (two) times daily. (Patient not taking: Reported on 03/01/2019) 10 capsule 0  . ibuprofen (ADVIL) 200 MG  tablet Take 200 mg by mouth every 6 (six) hours as needed for fever, headache or moderate pain.    Marland Kitchen oxyCODONE (OXY IR/ROXICODONE) 5 MG immediate release tablet Take 1 tablet (5 mg total) by mouth every 6 (six) hours as needed for moderate pain or severe pain (pain). (Patient not taking: Reported on 03/01/2019) 15 tablet 0  . oxyCODONE-acetaminophen (PERCOCET/ROXICET) 5-325 MG tablet Take 1 tablet by mouth every 6 (six) hours as needed. (Patient taking differently: Take 1 tablet by mouth every 6 (six) hours as needed for moderate pain. ) 10 tablet 0    OB/GYN Status:  No LMP for male patient.  General Assessment Data Location of Assessment: [facility] [address] Assessment Services TTS Assessment: In system Is this a Tele or Face-to-Face Assessment?: Face-to-Face Is this an Initial Assessment or a Re-assessment for this encounter?: Initial Assessment Patient Accompanied by:: N/A Language Other than English: No Living Arrangements: Other (Comment)(Pt living with wife and 78 year old daughter.) What gender do you identify as?: Male Marital status: Married Pregnancy Status: No Living Arrangements: Children, Spouse/significant other Can pt return to current living arrangement?: Yes Admission Status: Involuntary Petitioner: Family member Is patient capable of signing voluntary admission?: No Referral Source: Self/Family/Friend Insurance type: self pay  Medical Screening Exam ([facility] Walk-in ONLY) Medical Exam completed: Yes(Jason Allyson Sabal, FNP)  Crisis Care Plan Living Arrangements: Children, Spouse/significant other Name of Psychiatrist: None Name of Therapist: None  Education Status Is patient currently in school?: No Is the patient employed, unemployed or receiving disability?: Employed  Risk to self with the past 6 months Suicidal Ideation: No Has patient been a risk to self within the past 6 months prior to admission? : No Suicidal Intent: No Has patient had any suicidal intent within the past 6  months prior to admission? : No Is patient at risk for suicide?: No Suicidal Plan?: No Has patient had any suicidal plan within the past 6 months prior to admission? : No Access to Means: No What has been your use of drugs/alcohol within the last 12 months?: Cocaine Previous Attempts/Gestures: No How many times?: 0 Other Self Harm Risks: None Triggers for Past Attempts: None known Intentional Self Injurious Behavior: None Family Suicide History: No Recent stressful life event(s): Conflict (Comment)(Says wife was cheating on him.) Persecutory voices/beliefs?: No Depression: No Depression Symptoms: (Pt denies depressive symptoms.) Substance abuse history and/or treatment for substance abuse?: No Suicide prevention information given to non-admitted patients: Not applicable  Risk to Others within the past 6 months Homicidal Ideation: No Does patient have any lifetime risk of violence toward others beyond the six months prior to admission? : No Thoughts of Harm to Others: No Current Homicidal Intent: No Current Homicidal Plan: No Access to Homicidal Means: No Identified Victim:  No one History of harm to others?: No Assessment of Violence: None Noted Violent Behavior Description: None Does patient have access to weapons?: No Criminal Charges Pending?: No Does patient have a court date: No Is patient on probation?: No  Psychosis Hallucinations: None noted Delusions: None noted  Mental Status Report Appearance/Hygiene: Unremarkable Eye Contact: Good Motor Activity: Freedom of movement Speech: Logical/coherent Level of Consciousness: Alert Mood: Anxious Affect: Anxious Anxiety Level: None Thought Processes: Coherent, Relevant Judgement: Impaired Orientation: Person, Place, Situation, Time Obsessive Compulsive Thoughts/Behaviors: None  Cognitive Functioning Concentration: Normal Memory: Recent Intact, Remote Intact Is patient IDD: No Insight: Good Impulse Control:  Good Appetite: Good Have you had any weight changes? : No Change Sleep: No Change Total Hours of Sleep: 6 Vegetative Symptoms: None  ADLScreening [facility] Assessment Services) Patient's cognitive ability adequate to safely complete daily activities?: Yes Patient able to express need for assistance with ADLs?: Yes Independently performs ADLs?: Yes (appropriate for developmental age)  Prior Inpatient Therapy Prior Inpatient Therapy: No  Prior Outpatient Therapy Prior Outpatient Therapy: No Does patient have an ACCT team?: No Does patient have Intensive In-House Services?  : No Does patient have [facility] services? : No Does patient have P4CC services?: No  ADL Screening (condition at time of admission) Patient's cognitive ability adequate to safely complete daily activities?: Yes Is the patient deaf or have difficulty hearing?: No Does the patient have difficulty seeing, even when wearing glasses/contacts?: No Does the patient have difficulty concentrating, remembering, or making decisions?: No Patient able to express need for assistance with ADLs?: Yes Does the patient have difficulty dressing or bathing?: No Independently performs ADLs?: Yes (appropriate for developmental age) Does the patient have difficulty walking or climbing stairs?: No Weakness of Legs: None Weakness of Arms/Hands: None  Home Assistive Devices/Equipment Home Assistive Devices/Equipment: None  Therapy Consults (therapy consults require a physician order) PT Evaluation Needed: No OT Evalulation Needed: No SLP Evaluation Needed: No Abuse/Neglect Assessment (Assessment to be complete while patient is alone) Abuse/Neglect Assessment Can Be Completed: Yes Physical Abuse: Denies Verbal Abuse: Denies Sexual Abuse: Denies Exploitation of patient/patient's resources: Denies Self-Neglect: Denies Values / Beliefs Cultural Requests During Hospitalization: None Spiritual Requests During Hospitalization:  None Consults Spiritual Care Consult Needed: No Transition of Care Team Consult Needed: No Advance Directives (For Healthcare) Does Patient Have a Medical Advance Directive?: No Would patient like information on creating a medical advance directive?: No - Patient declined Nutrition Screen- [facility] Adult/[facility]/AP Patient's home diet: Regular Has the patient recently lost weight without trying?: No Has the patient been eating poorly because of a decreased appetite?: No Malnutrition Screening Tool Score: 0        Disposition:  Disposition Initial Assessment Completed for this Encounter: Yes Disposition of Patient: Admit(Observation unit) Type of inpatient treatment program: Adult Patient refused recommended treatment: No Mode of transportation if patient is discharged/movement?: N/A Patient referred to: Other (Comment)(Psychiatry to review IVC on 04/18)  On Site Evaluation by:   Reviewed with Physician:    Curlene Dolphin Ray 06/08/2019 11:21 PM

## 2019-06-08 NOTE — Plan of Care (Signed)
[facility] Observation Crisis Plan  Reason for Crisis Plan:  Crisis Stabilization   Plan of Care:  Referral for Inpatient Hospitalization  Family Support:      Current Living Environment:  Living Arrangements: Spouse/significant other  Insurance:   Hospital Account    Name Acct ID Class Status Primary Coverage   Garrett Richardson, Garrett Richardson [id number] BEHAVIORAL HEALTH OBSERVATION [place]        Guarantor Account (for Hospital Account [id number])    Name Relation to Pt Service Area Active? Acct Type   Garrett Richardson Self [facility] Yes Behavioral Health   Address Phone       [address] [place], [place] [address] [phone])          Coverage Information (for Hospital Account [id number])    F/O Payor/Plan Precert #   [facility] [place]    Subscriber Subscriber #   Garrett Richardson, Garrett Richardson [id number]   Address Phone   [address] [place], [place] [address] [phone]      Legal Guardian:     Primary Care Provider:  Patient, No Pcp Per  Current Outpatient Providers:  none  Psychiatrist:  Name of Psychiatrist: None  Counselor/Therapist:  Name of Therapist: None  Compliant with Medications:  No  Additional Information:   Garrett Richardson 4/17/202111:24 PM

## 2019-06-09 ENCOUNTER — Ambulatory Visit (HOSPITAL_COMMUNITY): Payer: Self-pay

## 2019-06-09 DIAGNOSIS — F15980 Other stimulant use, unspecified with stimulant-induced anxiety disorder: Secondary | ICD-10-CM | POA: Diagnosis not present

## 2019-06-09 DIAGNOSIS — F14980 Cocaine use, unspecified with cocaine-induced anxiety disorder: Secondary | ICD-10-CM | POA: Diagnosis not present

## 2019-06-09 LAB — COMPREHENSIVE METABOLIC PANEL
ALT: 18 U/L (ref 0–44)
AST: 21 U/L (ref 15–41)
Albumin: 4 g/dL (ref 3.5–5.0)
Alkaline Phosphatase: 78 U/L (ref 38–126)
Anion gap: 6 (ref 5–15)
BUN: 16 mg/dL (ref 6–20)
CO2: 29 mmol/L (ref 22–32)
Calcium: 9.3 mg/dL (ref 8.9–10.3)
Chloride: 104 mmol/L (ref 98–111)
Creatinine, Ser: 1.09 mg/dL (ref 0.61–1.24)
GFR calc Af Amer: >60 mL/min (ref >60)
GFR calc non Af Amer: >60 mL/min (ref >60)
Glucose, Bld: 114 mg/dL — ABNORMAL HIGH (ref 70–99)
Potassium: 3.8 mmol/L (ref 3.5–5.1)
Sodium: 139 mmol/L (ref 135–145)
Total Bilirubin: 1.5 mg/dL — ABNORMAL HIGH (ref 0.3–1.2)
Total Protein: 7.2 g/dL (ref 6.5–8.1)

## 2019-06-09 LAB — CBC
HCT: 41.7 % (ref 39.0–52.0)
Hemoglobin: 13.4 g/dL (ref 13.0–17.0)
MCH: 28.3 pg (ref 26.0–34.0)
MCHC: 32.1 g/dL (ref 30.0–36.0)
MCV: 88.2 fL (ref 80.0–100.0)
Platelets: 321 10*3/uL (ref 150–400)
RBC: 4.73 MIL/uL (ref 4.22–5.81)
RDW: 12.4 % (ref 11.5–15.5)
WBC: 8.1 10*3/uL (ref 4.0–10.5)
nRBC: 0 % (ref 0.0–0.2)

## 2019-06-09 LAB — RESPIRATORY PANEL BY RT PCR (FLU A&B, COVID)
Influenza A by PCR: NEGATIVE
Influenza B by PCR: NEGATIVE
SARS Coronavirus 2 by RT PCR: NEGATIVE

## 2019-06-09 NOTE — Progress Notes (Signed)
Pt is A&Ox4. Pt has been calm and cooperative since arrival with police under IVC. Pt stated he caught his wife cheating on him. Pt admitted to using cocaine today, denied drinking, denied SI/HI/AVH. Pt was irritable at situation and wife but not towards staff. Has no other type of complaint. Given fluids/food. Pt in room resting after assessment. Safety checks every 15 minutes maintained.

## 2019-06-09 NOTE — Patient Outreach (Signed)
CPSS tried meeting with the patient in the [facility] Observation Unit in order to provide substance use recovery support and information for substance use treatment resources, but the patient left before prior to meeting with CPSS. Per TTS assessment on 06/08/19, patient reported a history of off and on cocaine use. UDS was not completed. EtOH test was not ordered. Per progress psychiatry notes on 06/09/19, patient was not interested in getting connected to residential substance use treatment at this time.

## 2019-06-09 NOTE — Progress Notes (Addendum)
Pt awakened to perform skin search, drowsy but cooperative. MHT present to assist RN. Grip socks given to pt. Did not endorse any SI/HI/AVH this morning. Does have chronic pain from neck surgery. Breakfast will be brought to patient. Safety maintained throughout shift.

## 2019-06-09 NOTE — H&P (Signed)
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 male.  Clinician reviewed the IVC papers on patient.  Wife took out IVC.  Paperwork alleges that patient has been hostile & aggressive towards other, is having hallucinations; that he has not been eating, sleeping or tending t hygiene and that hs is using drugs & ETOH. Patient says that he has caught his wife cheating on him again.  He says he linked their phones and found out she has been cheating on him.  He referenced that this happened before in January. Pt was assessed on 03-01-19. Patient adamantly denies any SI.  He reports no previous plan or intention or attempt to kill himself.  Pt also denies any HI or A/V hallucinations.  Clinician asked if he had thoughts that people were at the door or trying to get at him and he denies those thoughts. He does admit to using cocaine tonight.  He said he was by himself at the home all day while wife and daughter were in [place].    Evaluation on Unit: Reviewed TTS assessment and validated with patient. Patient reports that his wife petitioned for IVC because he found out that she has been cheating on him. Patient was also petitioned  for IVC by his wife in January and at that time he also made statements that he caught his wife cheating.On evaluation patient is alert and oriented x 4, pleasant, and cooperative. Speech is clear and coherent. Mood is anxious and affect is congruent with mood. Thought process is coherent and thought content is logical. He does ruminate on wife cheating on him. Denies suicidal ideations. Denies homicidal ideations.  Reports occasional use of cocaine and does state that he used cocaine today. Denies use of other substances. Denies audiovisual hallucinations. No indication that patient is responding to internal stimuli.   Associated Signs/Symptoms: Depression Symptoms:  anxiety, (Hypo) Manic Symptoms:  Irritable Mood, Anxiety Symptoms:  denies Psychotic Symptoms:  Denies, none noted PTSD Symptoms: Denies Total Time spent with patient: 30 minutes  Past Psychiatric History: Cocaine use disorder. Denies history of inpatient admissions.   Is the patient at risk to self? No.  Has the patient been a risk to self in the past 6 months? No.  Has the patient been a risk to self within the distant past? No.  Is the patient a risk to others? No.  Has the patient been a risk to others in the past 6 months? No.  Has the patient been a risk to others within the distant past? No.   Prior Inpatient Therapy: Prior Inpatient Therapy: No Prior Outpatient Therapy: Prior Outpatient Therapy: No Does patient have an ACCT team?: No Does patient have Intensive In-House Services?  : No Does patient have [facility] services? : No Does patient have P4CC services?: No  Alcohol Screening: 1. How often do you have a drink containing alcohol?: Never 2. How many drinks containing alcohol do you have on a typical day when you are drinking?: 1 or 2 3. How often do you have six or more drinks on one occasion?: Never AUDIT-C Score: 0 4. How often during the last year have you found that you were not able to stop  drinking once you had started?: Never 5. How often during the last year have you failed to do what was normally expected from you becasue of drinking?: Never 6. How often during the last year have you needed a first drink in the morning to get yourself going after a heavy drinking session?: Never 7. How often during the last year have you had a feeling of guilt of remorse after drinking?: Never 8. How often during the last year  have you been unable to remember what happened the night before because you had been drinking?: Never 9. Have you or someone else been injured as a result of your drinking?: No 10. Has a relative or friend or a doctor or another health worker been concerned about your drinking or suggested you cut down?: No Alcohol Use Disorder Identification Test Final Score (AUDIT): 0 Alcohol Brief Interventions/Follow-up: AUDIT Score <7 follow-up not indicated Substance Abuse History in the last 12 months:  Yes.   Consequences of Substance Abuse: Family Consequences:  Family discord Previous Psychotropic Medications: No  Psychological Evaluations: No  Past Medical History:  Past Medical History:  Diagnosis Date  . Spondylolysis     Past Surgical History:  Procedure Laterality Date  . BACK SURGERY     neck  . POSTERIOR CERVICAL FUSION/FORAMINOTOMY N/A 03/07/2018   Procedure: POSTERIOR CERVICAL LAMINAPLASTY CERVICAL THREE-CERVICAL SIX;  Surgeon: Judith Part, MD;  Location: [place];  Service: Neurosurgery;  Laterality: N/A;  POSTERIOR CERVICAL LAMINAPLASTY CERVICAL THREE-CERVICAL SIX   Family History:  Family History  Problem Relation Age of Onset  . Diabetes Mother    Family Psychiatric History: Father-substance abuse Tobacco Screening:   Social History:  Social History   Substance and Sexual Activity  Alcohol Use Not Currently     Social History   Substance and Sexual Activity  Drug Use Yes  . Types: Other-see comments, Cocaine   Comment: did not say all    Additional Social History: Marital status: Married    Pain Medications: None Prescriptions: None Over the Counter: None History of alcohol / drug use?: Yes Name of Substance 1: Cocaine 1 - Age of First Use: Teens 1 - Amount (size/oz): Varies 1 - Frequency: Says that he only used it today 1 - Duration: off and on 1 - Last Use / Amount: 04/17       Allergies:   Allergies  Allergen Reactions  . Morphine And Related  Itching    Tolerates Dilaudid and PO oxycodone  . Banana   . Bee Venom    Lab Results:  Results for orders placed or performed during the hospital encounter of 06/08/19 (from the past 48 hour(s))  Respiratory Panel by RT PCR (Flu A&B, Covid) - Nasopharyngeal Swab     Status: None   Collection Time: 06/08/19 11:09 PM   Specimen: Nasopharyngeal Swab  Result Value Ref Range   SARS Coronavirus 2 by RT PCR NEGATIVE NEGATIVE    Comment: (NOTE) SARS-CoV-2 target nucleic acids are NOT DETECTED. The SARS-CoV-2 RNA is generally detectable in upper respiratoy specimens during the acute phase of infection. The lowest concentration of SARS-CoV-2 viral copies this assay can detect is 131 copies/mL. A negative result does not preclude SARS-Cov-2 infection and should not be used as the sole basis for treatment or other patient management decisions. A negative result may occur with  improper specimen collection/handling, submission of specimen other than nasopharyngeal swab, presence of viral mutation(s) within the areas targeted by this assay, and inadequate number  of viral copies (<131 copies/mL). A negative result must be combined with clinical observations, patient history, and epidemiological information. The expected result is Negative. Fact Sheet for Patients:  https://www.moore.com/ Fact Sheet for Healthcare Providers:  https://www.young.biz/ This test is not yet ap proved or cleared by the [place] FDA and  has been authorized for detection and/or diagnosis of SARS-CoV-2 by FDA under an Emergency Use Authorization (EUA). This EUA will remain  in effect (meaning this test can be used) for the duration of the COVID-19 declaration under Section 564(b)(1) of the Act, 21 U.S.C. section 360bbb-3(b)(1), unless the authorization is terminated or revoked sooner.    Influenza A by PCR NEGATIVE NEGATIVE   Influenza B by PCR NEGATIVE NEGATIVE    Comment:  (NOTE) The Xpert Xpress SARS-CoV-2/FLU/RSV assay is intended as an aid in  the diagnosis of influenza from Nasopharyngeal swab specimens and  should not be used as a sole basis for treatment. Nasal washings and  aspirates are unacceptable for Xpert Xpress SARS-CoV-2/FLU/RSV  testing. Fact Sheet for Patients: https://www.moore.com/ Fact Sheet for Healthcare Providers: https://www.young.biz/ This test is not yet approved or cleared by the [place] FDA and  has been authorized for detection and/or diagnosis of SARS-CoV-2 by  FDA under an Emergency Use Authorization (EUA). This EUA will remain  in effect (meaning this test can be used) for the duration of the  Covid-19 declaration under Section 564(b)(1) of the Act, 21  U.S.C. section 360bbb-3(b)(1), unless the authorization is  terminated or revoked. Performed at [facility], [address], [place], [place] [address]     Blood Alcohol level:  Lab Results  Component Value Date   ETH <10 03/01/2019   ETH <10 02/24/2018    Metabolic Disorder Labs:  No results found for: HGBA1C, MPG No results found for: PROLACTIN No results found for: CHOL, TRIG, HDL, CHOLHDL, VLDL, LDLCALC  Current Medications: Current Facility-Administered Medications  Medication Dose Route Frequency Provider Last Rate Last Admin  . acetaminophen (TYLENOL) tablet 650 mg  650 mg Oral Q6H PRN Jackelyn Poling, NP      . alum & mag hydroxide-simeth (MAALOX/MYLANTA) 200-200-20 MG/5ML suspension 30 mL  30 mL Oral Q4H PRN Nira Conn A, NP      . hydrOXYzine (ATARAX/VISTARIL) tablet 25 mg  25 mg Oral TID PRN Nira Conn A, NP      . magnesium hydroxide (MILK OF MAGNESIA) suspension 30 mL  30 mL Oral Daily PRN Nira Conn A, NP      . traZODone (DESYREL) tablet 50 mg  50 mg Oral QHS PRN Jackelyn Poling, NP       PTA Medications: Medications Prior to Admission  Medication Sig Dispense Refill Last Dose   . cyclobenzaprine (FLEXERIL) 10 MG tablet Take 1 tablet (10 mg total) by mouth 3 (three) times daily as needed for muscle spasms. (Patient not taking: Reported on 03/01/2019) 30 tablet 2 Unknown at Unknown time  . docusate sodium (COLACE) 100 MG capsule Take 1 capsule (100 mg total) by mouth 2 (two) times daily. (Patient not taking: Reported on 03/01/2019) 10 capsule 0 Unknown at Unknown time  . ibuprofen (ADVIL) 200 MG tablet Take 200 mg by mouth every 6 (six) hours as needed for fever, headache or moderate pain.   Unknown at Unknown time  . oxyCODONE (OXY IR/ROXICODONE) 5 MG immediate release tablet Take 1 tablet (5 mg total) by mouth every 6 (six) hours as needed for moderate pain or severe pain (  pain). (Patient not taking: Reported on 03/01/2019) 15 tablet 0 Unknown at Unknown time  . oxyCODONE-acetaminophen (PERCOCET/ROXICET) 5-325 MG tablet Take 1 tablet by mouth every 6 (six) hours as needed. (Patient taking differently: Take 1 tablet by mouth every 6 (six) hours as needed for moderate pain. ) 10 tablet 0 Unknown at Unknown time    Musculoskeletal: Strength & Muscle Tone: within normal limits Gait & Station: normal Patient leans: N/A  Psychiatric Specialty Exam: Physical Exam  Constitutional: He appears well-developed and well-nourished. No distress.  HENT:  Head: Normocephalic and atraumatic.  Right Ear: External ear normal.  Left Ear: External ear normal.  Eyes: Pupils are equal, round, and reactive to light. Right eye exhibits no discharge. Left eye exhibits no discharge.  Respiratory: Effort normal. No respiratory distress.  Skin: He is not diaphoretic.    Review of Systems  Constitutional: Negative for activity change, appetite change, chills, diaphoresis, fatigue, fever and unexpected weight change.  Respiratory: Negative for cough and shortness of breath.   Cardiovascular: Negative for chest pain.  Gastrointestinal: Negative for diarrhea, nausea and vomiting.  Skin: Negative.    Neurological: Negative for dizziness and headaches.  Psychiatric/Behavioral: Positive for sleep disturbance. Negative for decreased concentration, dysphoric mood, hallucinations and suicidal ideas. The patient is nervous/anxious.   All other systems reviewed and are negative.   Blood pressure 118/80, pulse (!) 117, temperature 98 F (36.7 C), temperature source Oral, resp. rate 20, SpO2 100 %.There is no height or weight on file to calculate BMI.  General Appearance: Casual  Eye Contact:  Good  Speech:  Clear and Coherent and Normal Rate  Volume:  Normal  Mood:  Anxious  Affect:  Congruent  Thought Process:  Coherent, Linear and Descriptions of Associations: Intact  Orientation:  Full (Time, Place, and Person)  Thought Content:  Logical and Hallucinations: None  Suicidal Thoughts:  No  Homicidal Thoughts:  No  Memory:  Immediate;   Fair Recent;   Fair Remote;   Fair  Judgement:  Fair  Insight:  Fair  Psychomotor Activity:  Restlessness  Concentration:  Concentration: Fair and Attention Span: Fair  Recall:  Good  Fund of Knowledge:  Good  Language:  Good  Akathisia:  Negative  Handed:  Right  AIMS (if indicated):     Assets:  Communication Skills Desire for Improvement Financial Resources/Insurance Housing Leisure Time Physical Health  ADL's:  Intact  Cognition:  WNL  Sleep:         Treatment Plan Summary: Daily contact with patient to assess and evaluate symptoms and progress in treatment and Medication management  Observation Level/Precautions:  15 minute checks Laboratory:  CBC Chemistry Profile UDS Psychotherapy:  Individual Medications:  Hydroxyzine 25 mg TID prn for anxiety Trazodone 50 mg QHS prn for sleep Consultations:  social work Discharge Concerns:  safety Estimated LOS: Other:      Jackelyn Poling, NP 4/18/20212:32 AM

## 2019-06-09 NOTE — Progress Notes (Signed)
Patient A&O x 4, ambulatory, calm and cooperative. Patient discharged in no acute distress. Patient denied SI/HI, A/VH upon discharge. Patient verbalized understanding of all discharge instructions explained by staff. Patient reported mood 10/10.  Pt belongings returned to patient from safe intact and belonging sheet signed. Patient discharged to lobby for safe transport. Goals met, safety maintained.

## 2019-06-09 NOTE — Progress Notes (Signed)
Patient ID: Garrett Richardson, male   DOB: Jan 07, 1976, 44 y.o.   MRN: [id number] 06/09/2019 at 7:45 AM  Observation unit progress note  44 year old married male.  Presented under IVC generated by wife, reporting patient has been behaving aggressively, not sleeping or eating properly, not attending to hygiene, has been aggressive, hostile, and has been using drugs/alcohol. Staff contacted wife for collateral information-she reported that he has relapsed on drugs and that when he uses drugs he becomes "a different person", loving and caring 1 day and then verbally abusive and mean the next.  She reported he becomes paranoid, thinking that there are people watching him, and states he has an upcoming court date for DUI in May.  Patient reports that he recently found out that his wife has been "cheating on me" .  He reports he found via her cell phone.  States that this had happened before in January as well.  He states "I was upset so I did go out and use some cocaine", but currently denies any recent regular pattern of cocaine or other drug use.  Denies alcohol use recently.  He states he had calmed down, returned home and "was going to go to sleep" when the police came.  Patient currently denies past psychiatric history. He denies history of significant depression, denies history of mania or hypomania, denies history of psychosis.  Does state that his wife also IVCd him back in January 2021. At the time IVC reported patient was hallucinating and suicidal, which patient adamantly denied.  He acknowledged the past history of substance abuse but reported abstinence.  UDS was positive for amphetamines and cannabis.  At the time he was evaluated by psychiatry consultant.  Was deemed patient was not suicidal, homicidal, or psychotic and was discharged.  Report from staff is that patient has had no psychomotor agitation or disruptive behaviors since presenting to the Obs Unit .   Labs reviewed-BMP and CBC  unremarkable.  Covid negative.  No recent UDS-most recent UDS is from March 01, 2019 at which time he was positive for opiates, amphetamines, cannabis.  Currently patient is alert, attentive, polite, vaguely anxious at first but affect tended to improve during session. Presents euthymic and denies feeling depressed, speech is normal in tone volume and rate, no thought disorders noted, denies hallucinations and does not appear internally preoccupied, no delusions are expressed at this time, denies any suicidal or self-injurious ideations, denies any homicidal or violent ideations, specifically denies any violent or homicidal ideations towards wife and states "I love her, we have a child, I just want the best for them".    Assessment and Plan-  At this time patient is alert, attentive, calm and in good control, denying psychotic symptoms and not appearing internally preoccupied, denying any suicidal or self-injurious ideations or any homicidal or violent ideations and specifically denying any violent ideations towards his wife. 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  I have reviewed the negative impact that substance use is likely having on his level of functioning and on his relationships and have encouraged him to consider abstinence and substance abuse treatment.  There are no current grounds for involuntary commitment and patient is not interested in inpatient or residential treatment options at present.  Peer Support consultation, CSW/staff to work on disposition planning , with recommendation to attend substance abuse treatment .  Sallyanne Havers, MD

## 2019-07-23 ENCOUNTER — Encounter (HOSPITAL_COMMUNITY): Payer: Self-pay

## 2019-07-23 ENCOUNTER — Other Ambulatory Visit: Payer: Self-pay

## 2019-07-23 ENCOUNTER — Emergency Department (HOSPITAL_COMMUNITY): Payer: Medicaid Other

## 2019-07-23 ENCOUNTER — Inpatient Hospital Stay (HOSPITAL_COMMUNITY)
Admission: EM | Admit: 2019-07-23 | Discharge: 2019-07-25 | DRG: 917 | Disposition: A | Discharge status: Home / Self Care | Payer: Medicaid Other | Attending: Emergency Medicine | Admitting: Emergency Medicine

## 2019-07-23 DIAGNOSIS — W19XXXA Unspecified fall, initial encounter: Secondary | ICD-10-CM | POA: Diagnosis present

## 2019-07-23 DIAGNOSIS — J9601 Acute respiratory failure with hypoxia: Secondary | ICD-10-CM

## 2019-07-23 DIAGNOSIS — I2510 Atherosclerotic heart disease of native coronary artery without angina pectoris: Secondary | ICD-10-CM | POA: Diagnosis present

## 2019-07-23 DIAGNOSIS — S62339A Displaced fracture of neck of unspecified metacarpal bone, initial encounter for closed fracture: Secondary | ICD-10-CM

## 2019-07-23 DIAGNOSIS — G9340 Encephalopathy, unspecified: Secondary | ICD-10-CM

## 2019-07-23 DIAGNOSIS — J0140 Acute pansinusitis, unspecified: Secondary | ICD-10-CM | POA: Diagnosis present

## 2019-07-23 DIAGNOSIS — G4710 Hypersomnia, unspecified: Secondary | ICD-10-CM | POA: Diagnosis present

## 2019-07-23 DIAGNOSIS — G92 Toxic encephalopathy: Secondary | ICD-10-CM | POA: Diagnosis present

## 2019-07-23 DIAGNOSIS — R739 Hyperglycemia, unspecified: Secondary | ICD-10-CM | POA: Diagnosis present

## 2019-07-23 DIAGNOSIS — T402X1A Poisoning by other opioids, accidental (unintentional), initial encounter: Principal | ICD-10-CM | POA: Diagnosis present

## 2019-07-23 DIAGNOSIS — Z8673 Personal history of transient ischemic attack (TIA), and cerebral infarction without residual deficits: Secondary | ICD-10-CM

## 2019-07-23 DIAGNOSIS — S62307A Unspecified fracture of fifth metacarpal bone, left hand, initial encounter for closed fracture: Secondary | ICD-10-CM | POA: Diagnosis present

## 2019-07-23 DIAGNOSIS — F17210 Nicotine dependence, cigarettes, uncomplicated: Secondary | ICD-10-CM | POA: Diagnosis present

## 2019-07-23 DIAGNOSIS — R400 Somnolence: Secondary | ICD-10-CM

## 2019-07-23 DIAGNOSIS — G8929 Other chronic pain: Secondary | ICD-10-CM | POA: Diagnosis present

## 2019-07-23 DIAGNOSIS — T50901A Poisoning by unspecified drugs, medicaments and biological substances, accidental (unintentional), initial encounter: Secondary | ICD-10-CM | POA: Diagnosis present

## 2019-07-23 DIAGNOSIS — Z20822 Contact with and (suspected) exposure to covid-19: Secondary | ICD-10-CM | POA: Diagnosis present

## 2019-07-23 LAB — CBC WITH DIFFERENTIAL/PLATELET
Abs Immature Granulocytes: 0.26 10*3/uL — ABNORMAL HIGH (ref 0.00–0.07)
Basophils Absolute: 0.1 10*3/uL (ref 0.0–0.1)
Basophils Relative: 0 %
Eosinophils Absolute: 0.2 10*3/uL (ref 0.0–0.5)
Eosinophils Relative: 1 %
HCT: 34.1 % — ABNORMAL LOW (ref 39.0–52.0)
Hemoglobin: 11.1 g/dL — ABNORMAL LOW (ref 13.0–17.0)
Immature Granulocytes: 1 %
Lymphocytes Relative: 10 %
Lymphs Abs: 3 10*3/uL (ref 0.7–4.0)
MCH: 28.9 pg (ref 26.0–34.0)
MCHC: 32.6 g/dL (ref 30.0–36.0)
MCV: 88.8 fL (ref 80.0–100.0)
Monocytes Absolute: 1.6 10*3/uL — ABNORMAL HIGH (ref 0.1–1.0)
Monocytes Relative: 5 %
Neutro Abs: 25 10*3/uL — ABNORMAL HIGH (ref 1.7–7.7)
Neutrophils Relative %: 83 %
Platelets: 263 10*3/uL (ref 150–400)
RBC: 3.84 MIL/uL — ABNORMAL LOW (ref 4.22–5.81)
RDW: 13 % (ref 11.5–15.5)
WBC Morphology: INCREASED
WBC: 30.2 10*3/uL — ABNORMAL HIGH (ref 4.0–10.5)
nRBC: 0 % (ref 0.0–0.2)

## 2019-07-23 LAB — RAPID URINE DRUG SCREEN, HOSP PERFORMED
Amphetamines: NOT DETECTED
Barbiturates: NOT DETECTED
Benzodiazepines: NOT DETECTED
Cocaine: NOT DETECTED
Opiates: NOT DETECTED
Tetrahydrocannabinol: NOT DETECTED

## 2019-07-23 LAB — COMPREHENSIVE METABOLIC PANEL
ALT: 133 U/L — ABNORMAL HIGH (ref 0–44)
AST: 222 U/L — ABNORMAL HIGH (ref 15–41)
Albumin: 3.5 g/dL (ref 3.5–5.0)
Alkaline Phosphatase: 179 U/L — ABNORMAL HIGH (ref 38–126)
Anion gap: 11 (ref 5–15)
BUN: 13 mg/dL (ref 6–20)
CO2: 24 mmol/L (ref 22–32)
Calcium: 8.1 mg/dL — ABNORMAL LOW (ref 8.9–10.3)
Chloride: 103 mmol/L (ref 98–111)
Creatinine, Ser: 1.25 mg/dL — ABNORMAL HIGH (ref 0.61–1.24)
GFR calc Af Amer: >60 mL/min (ref >60)
GFR calc non Af Amer: >60 mL/min (ref >60)
Glucose, Bld: 269 mg/dL — ABNORMAL HIGH (ref 70–99)
Potassium: 3.7 mmol/L (ref 3.5–5.1)
Sodium: 138 mmol/L (ref 135–145)
Total Bilirubin: 0.7 mg/dL (ref 0.3–1.2)
Total Protein: 6.5 g/dL (ref 6.5–8.1)

## 2019-07-23 LAB — AMMONIA: Ammonia: 33 umol/L (ref 9–35)

## 2019-07-23 LAB — ETHANOL: Alcohol, Ethyl (B): <10 mg/dL (ref <10)

## 2019-07-23 LAB — SALICYLATE LEVEL: Salicylate Lvl: <7 mg/dL — ABNORMAL LOW (ref 7.0–30.0)

## 2019-07-23 LAB — ACETAMINOPHEN LEVEL: Acetaminophen (Tylenol), Serum: <10 ug/mL — ABNORMAL LOW (ref 10–30)

## 2019-07-23 LAB — CBG MONITORING, ED: Glucose-Capillary: 255 mg/dL — ABNORMAL HIGH (ref 70–99)

## 2019-07-23 MED ORDER — NALOXONE HCL 0.4 MG/ML IJ SOLN
0.4000 mg | Freq: Once | INTRAMUSCULAR | Status: AC
  Administered 2019-07-23: 0.4 mg via INTRAVENOUS
  Filled 2019-07-23: qty 1

## 2019-07-23 MED ORDER — NALOXONE HCL 4 MG/10ML IJ SOLN
0.5000 mg/h | INTRAVENOUS | Status: DC
  Administered 2019-07-23: 0.5 mg/h via INTRAVENOUS
  Filled 2019-07-23 (×2): qty 10

## 2019-07-23 MED ORDER — SODIUM CHLORIDE 0.9 % IV BOLUS
1000.0000 mL | Freq: Once | INTRAVENOUS | Status: AC
  Administered 2019-07-23: 1000 mL via INTRAVENOUS

## 2019-07-23 NOTE — ED Notes (Signed)
Urine and culture sent down to lab 

## 2019-07-23 NOTE — ED Triage Notes (Signed)
Patient arrived to ED by EMS after reported overdose, reported downtime of 5 minutes before Narcan was administered, received a total of 4mg , possibly heroin but patient denies any drug use, arrives to ED still altered.

## 2019-07-23 NOTE — ED Provider Notes (Signed)
[facility]-EMERGENCY DEPT Provider Note   CSN: [id number] Arrival date & time: 07/23/19  1949     History Chief Complaint  Patient presents with   Drug Overdose   Hyperglycemia   Level 5 caveat due to altered mental status Garrett Richardson is a 44 y.o. male brought in by EMS for evaluation after reported overdose.  Per EMS, patient was found down on the sidewalk by his wife, reportedly had a downtime around 5 minutes where he was unresponsive before Narcan was administered by the wife.  EMS also administered Narcan and supported respirations with BVM.  Patient's mental status has improved somewhat since then.  SPO2 saturations 92% on room air.  He is alert and oriented to person place and time.  He tells me that he was building a dog house earlier today.  He denies recreational drug use of any kind.  He states that he smokes [place] cigarettes.  Drinks alcohol occasionally.  I spoke with the patient's wife who is now at the bedside.  She states that a few days ago she picked patient up "from a meth house".  She states that he has been known to use meth intranasally but she does not think that he uses IV drugs though is unsure.  She states that "I have been with him for the last few days, and I have even followed him into the bathroom.  I am not sure where he could have gotten something but he seemed like he was high today but I do not want to stereotype him".  She states that while she was driving them back to their home this evening at around 7 PM he began to complain of nasal congestion and feeling tired.  She notes that he then became unresponsive, exhibited sonorous respirations while in the vehicle.  She states that she pulled him out of the vehicle when she got home and administered IM Narcan.  She states that the EMTs then arrived and administered multiple doses of Narcan.  She also notes pain to the patient's left hand that he has been complaining of for couple of days.   She states that she thinks a heavy object fell on it.  The history is provided by the patient and the EMS personnel.       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   Spinal cord injury, cervical region (HCC) 03/06/2018    Past Surgical History:  Procedure Laterality Date   BACK SURGERY     neck   POSTERIOR CERVICAL FUSION/FORAMINOTOMY N/A 03/07/2018   Procedure: POSTERIOR CERVICAL LAMINAPLASTY CERVICAL THREE-CERVICAL SIX;  Surgeon: [name], MD;  Location: [facility] OR;  Service: Neurosurgery;  Laterality: N/A;  POSTERIOR CERVICAL LAMINAPLASTY CERVICAL THREE-CERVICAL SIX       Family History  Problem Relation Age of Onset   Diabetes Mother     Social History   Tobacco Use   Smoking status: Current Every Day Smoker    Packs/day: 2.00    Types: Cigarettes   Smokeless tobacco: Current User  Substance Use Topics   Alcohol use: Not Currently   Drug use: Yes    Types: Other-see comments, Cocaine    Comment: did not say all    Home Medications Prior to Admission medications   Not on File    Allergies    Morphine and related, Banana,  and Bee venom  Review of Systems   Review of Systems  Unable to perform ROS: Mental status change    Physical Exam Updated Vital Signs BP 113/82    Pulse 80    Temp 98.4 F (36.9 C) (Oral)    Resp 14    Ht 5\' 7"  (1.702 m)    Wt 74.8 kg    SpO2 100%    BMI 25.84 kg/m   Physical Exam Vitals and nursing note reviewed.  Constitutional:      General: He is not in acute distress.    Appearance: He is well-developed.  HENT:     Head: Normocephalic and atraumatic.     Comments: No Battle's signs, no raccoon's eyes, no rhinorrhea. No hemotympanum. No tenderness to palpation of the face or skull. No deformity, crepitus, or swelling noted.  Eyes:     General:        Right eye: No discharge.         Left eye: No discharge.     Conjunctiva/sclera: Conjunctivae normal.     Pupils: Pupils are equal, round, and reactive to light.     Comments: Pupils pinpoint but reactive to light  Neck:     Vascular: No JVD.     Trachea: No tracheal deviation.  Cardiovascular:     Rate and Rhythm: Regular rhythm. Tachycardia present.  Pulmonary:     Breath sounds: Rhonchi present.     Comments: Diffuse expiratory rhonchi.  SPO2 saturations 92 to 94% on room air.  Patient is bradypneic with respiratory rate around 4-8 respirations a minute. Chest:     Chest wall: No tenderness.  Abdominal:     General: Abdomen is flat. Bowel sounds are normal. There is no distension.     Palpations: Abdomen is soft.     Tenderness: There is no abdominal tenderness. There is no guarding or rebound.  Musculoskeletal:        General: Tenderness present.     Cervical back: Neck supple.     Comments: Patient winces upon palpation of the left fifth metacarpal.  There is some overlying swelling.  No crepitus.  Sensation intact to light touch of extremities.  Skin:    General: Skin is warm and dry.     Findings: No erythema.  Neurological:     Mental Status: He is alert.     Comments: Drowsy but easily arouses, briefly before becoming somnolent again.  Oriented to person place and time.  Moves all extremities spontaneously without difficulty.  Sensation intact to light touch of face and extremities.  No cranial nerve deficit noted.  Speech is mildly dysarthric.  Psychiatric:        Behavior: Behavior normal.     ED Results / Procedures / Treatments   Labs (all labs ordered are listed, but only abnormal results are displayed) Labs Reviewed  COMPREHENSIVE METABOLIC PANEL - Abnormal; Notable for the following components:      Result Value   Glucose, Bld 269 (*)    Creatinine, Ser 1.25 (*)    Calcium 8.1 (*)    AST 222 (*)    ALT 133 (*)    Alkaline Phosphatase 179 (*)    All other components within normal limits    CBC WITH DIFFERENTIAL/PLATELET - Abnormal; Notable for the following components:   WBC 30.2 (*)    RBC 3.84 (*)    Hemoglobin 11.1 (*)    HCT 34.1 (*)    Neutro Abs 25.0 (*)  Monocytes Absolute 1.6 (*)    Abs Immature Granulocytes 0.26 (*)    All other components within normal limits  ACETAMINOPHEN LEVEL - Abnormal; Notable for the following components:   Acetaminophen (Tylenol), Serum <10 (*)    All other components within normal limits  SALICYLATE LEVEL - Abnormal; Notable for the following components:   Salicylate Lvl <7.0 (*)    All other components within normal limits  CBG MONITORING, ED - Abnormal; Notable for the following components:   Glucose-Capillary 255 (*)    All other components within normal limits  SARS CORONAVIRUS 2 BY RT PCR (HOSPITAL ORDER, PERFORMED IN [facility])  CULTURE, BLOOD (ROUTINE X 2)  CULTURE, BLOOD (ROUTINE X 2)  ETHANOL  RAPID URINE DRUG SCREEN, HOSP PERFORMED  AMMONIA  HEPATITIS PANEL, ACUTE  LACTIC ACID, PLASMA  LACTIC ACID, PLASMA    EKG None  Radiology DG Chest Portable 1 View  Result Date: 07/23/2019 CLINICAL DATA:  Hypoxia, possible overdose EXAM: PORTABLE CHEST 1 VIEW COMPARISON:  03/01/2019 FINDINGS: Cardiac shadow is within normal limits. The lungs are clear. No acute bony abnormality is noted. Degenerative changes of the acromioclavicular joints are noted. IMPRESSION: No active disease. Electronically Signed   By: Alcide Clever M.D.   On: 07/23/2019 21:02   DG Hand Complete Left  Result Date: 07/23/2019 CLINICAL DATA:  Possible overdose EXAM: LEFT HAND - COMPLETE 3+ VIEW COMPARISON:  None. FINDINGS: Comminuted fracture of the distal aspect of the fifth metacarpal is noted with angulation anteriorly. No other fractures are seen. Soft tissue swelling is noted. IMPRESSION: Mildly angulated fifth metacarpal fracture distally. Electronically Signed   By: Alcide Clever M.D.   On: 07/23/2019 21:01    Procedures .Critical  Care Performed by: Jeanie Sewer, PA-C Authorized by: Jeanie Sewer, PA-C   Critical care provider statement:    Critical care time (minutes):  60   Critical care was necessary to treat or prevent imminent or life-threatening deterioration of the following conditions:  Respiratory failure and toxidrome   Critical care was time spent personally by me on the following activities:  Discussions with consultants, evaluation of patient's response to treatment, examination of patient, ordering and performing treatments and interventions, ordering and review of laboratory studies, ordering and review of radiographic studies, pulse oximetry, re-evaluation of patient's condition, obtaining history from patient or surrogate and review of old charts   (including critical care time)  Medications Ordered in ED Medications  naloxone HCl (NARCAN) 4 mg in dextrose 5 % 250 mL infusion (0.5 mg/hr Intravenous New Bag/Given 07/23/19 2234)  piperacillin-tazobactam (ZOSYN) IVPB 3.375 g (has no administration in time range)  sodium chloride 0.9 % bolus 1,000 mL (0 mLs Intravenous Stopped 07/23/19 2235)  naloxone (NARCAN) injection 0.4 mg (0.4 mg Intravenous Given 07/23/19 2051)    ED Course  I have reviewed the triage vital signs and the nursing notes.  Pertinent labs & imaging results that were available during my care of the patient were reviewed by me and considered in my medical decision making (see chart for details).    MDM Rules/Calculators/A&P                      Patient presents via EMS after being found unresponsive.  He is afebrile in the ED.  He exhibits decreased respiratory drive, borderline hypoxic and is somnolent on my assessment.  He exhibits diffuse rhonchi on auscultation of the lungs.  He does arouse easily but only  for a very brief period of time before falling back asleep.  Pupils are pinpoint but reactive to light.  Mental status and respiratory drive improved with Narcan.  He received a dose  with his wife and multiple doses with EMS as well as a dose in the ED so he was placed on a Narcan drip in order to improve mentation and respiratory status.  When awake, he exhibits no focal neurologic deficits.  Wife is concerned that he is using drugs but he denies any drug use to me.    Lab work reviewed and interpreted by myself shows leukocytosis of 30.2 with left shift.  Creatinine is mildly elevated but BUN is within normal limits.  He is hyperglycemic but does is not in DKA.  His LFTs are elevated but total bilirubin is within normal limits and he has no tenderness to palpation of the abdomen.  At this time I am less inclined to think that he has any acute surgical abdominal pathology such as cholecystitis or choledocholithiasis or ascending cholangitis in the absence of fever or pain on examination.  More likely differential considerations include hepatitis and alcoholic cirrhosis or fatty liver disease.  He denies IV drug use but is not a reliable historian.  We will add on hepatitis panel.  UDS is surprisingly negative for opioids or other drugs, however some metabolites of opioids do not show up on UDS.  His presentation is most consistent with opioid overdose and he does improve with Narcan.  His chest x-ray shows no acute cardiopulmonary abnormalities but with leukocytosis and rhonchi there is concern for possible aspiration pneumonia so we will start him on Zosyn.  Radiographs of the left hand show mildly angulated fifth metacarpal fracture distally.  On reevaluation the patient tells me that he punched a wall but cannot tell me when.  He was placed in an ulnar gutter splint.  He remains neurovascularly intact after splint placement.  CONSULT: Spoke with Dr. Arsenio Loader with intensivist service who will place a call to the ground team to evaluate the patient emergently in the ED.   Spoke with Dr. Carlota Raspberry with intensivist service.  She states that she is hesitant to admit the patient on a  Narcan drip as his presentation does not appear consistent with opioid overdose to her given negative UDS.  She feels that in the presence of elevated LFTs that his symptoms could be related to alcohol abuse however the patient's ethanol levels are negative today.  His presentation is most consistent with opioid overdose.  I discussed with her that the patient's respiratory drive is compromised without the Narcan drip.  She recommended trialing the patient off the Narcan drip and placing him on end-tidal CO2 monitor to see if he is able to be weaned off of the Narcan drip.   The Narcan drip was discontinued and shortly thereafter the patient became more somnolent.  His end-tidal CO2 went from 35-46, and his respiratory drive worsened and he became bradypnea.  SPO2 saturations dropped to 89% on room air.  I spoke with Dr. Arsenio Loader with the intensivist service and relayed these findings to him.  Narcan drip restarted with improvement on reevaluation.  Care signed out to PA McDonald to follow-up on results.  Plan for likely admission for further evaluation and management of altered mental status.  Final Clinical Impression(s) / ED Diagnoses Final diagnoses:  Somnolence  Acute respiratory failure with hypoxia (HCC)  Closed boxer's fracture, initial encounter    Rx / DC Orders  ED Discharge Orders    None       Bennye AlmFawze, Doxie Augenstein A, PA-C 07/24/19 0129    Pollyann SavoySheldon, Charles B, MD 07/24/19 1029

## 2019-07-23 NOTE — ED Notes (Signed)
ED Provider at bedside. 

## 2019-07-23 NOTE — ED Notes (Signed)
Called Ortho tech@23 :57pm.

## 2019-07-24 ENCOUNTER — Inpatient Hospital Stay (HOSPITAL_COMMUNITY): Payer: Medicaid Other

## 2019-07-24 DIAGNOSIS — Z8673 Personal history of transient ischemic attack (TIA), and cerebral infarction without residual deficits: Secondary | ICD-10-CM | POA: Diagnosis not present

## 2019-07-24 DIAGNOSIS — W19XXXA Unspecified fall, initial encounter: Secondary | ICD-10-CM | POA: Diagnosis present

## 2019-07-24 DIAGNOSIS — G9340 Encephalopathy, unspecified: Secondary | ICD-10-CM

## 2019-07-24 DIAGNOSIS — T402X1A Poisoning by other opioids, accidental (unintentional), initial encounter: Secondary | ICD-10-CM | POA: Diagnosis present

## 2019-07-24 DIAGNOSIS — F17210 Nicotine dependence, cigarettes, uncomplicated: Secondary | ICD-10-CM | POA: Diagnosis present

## 2019-07-24 DIAGNOSIS — S62307A Unspecified fracture of fifth metacarpal bone, left hand, initial encounter for closed fracture: Secondary | ICD-10-CM | POA: Diagnosis present

## 2019-07-24 DIAGNOSIS — J9601 Acute respiratory failure with hypoxia: Secondary | ICD-10-CM

## 2019-07-24 DIAGNOSIS — T50901A Poisoning by unspecified drugs, medicaments and biological substances, accidental (unintentional), initial encounter: Secondary | ICD-10-CM | POA: Diagnosis present

## 2019-07-24 DIAGNOSIS — R400 Somnolence: Secondary | ICD-10-CM | POA: Diagnosis not present

## 2019-07-24 DIAGNOSIS — G4710 Hypersomnia, unspecified: Secondary | ICD-10-CM | POA: Diagnosis present

## 2019-07-24 DIAGNOSIS — G8929 Other chronic pain: Secondary | ICD-10-CM | POA: Diagnosis present

## 2019-07-24 DIAGNOSIS — R739 Hyperglycemia, unspecified: Secondary | ICD-10-CM | POA: Diagnosis present

## 2019-07-24 DIAGNOSIS — I2510 Atherosclerotic heart disease of native coronary artery without angina pectoris: Secondary | ICD-10-CM | POA: Diagnosis present

## 2019-07-24 DIAGNOSIS — Z20822 Contact with and (suspected) exposure to covid-19: Secondary | ICD-10-CM | POA: Diagnosis present

## 2019-07-24 DIAGNOSIS — J0140 Acute pansinusitis, unspecified: Secondary | ICD-10-CM | POA: Diagnosis present

## 2019-07-24 DIAGNOSIS — G92 Toxic encephalopathy: Secondary | ICD-10-CM | POA: Diagnosis present

## 2019-07-24 LAB — LIPID PANEL
Cholesterol: 110 mg/dL (ref 0–200)
HDL: 46 mg/dL (ref >40)
LDL Cholesterol: 57 mg/dL (ref 0–99)
Total CHOL/HDL Ratio: 2.4 RATIO
Triglycerides: 33 mg/dL (ref <150)
VLDL: 7 mg/dL (ref 0–40)

## 2019-07-24 LAB — GLUCOSE, CAPILLARY
Glucose-Capillary: 106 mg/dL — ABNORMAL HIGH (ref 70–99)
Glucose-Capillary: 104 mg/dL — ABNORMAL HIGH (ref 70–99)
Glucose-Capillary: 102 mg/dL — ABNORMAL HIGH (ref 70–99)
Glucose-Capillary: 158 mg/dL — ABNORMAL HIGH (ref 70–99)
Glucose-Capillary: 117 mg/dL — ABNORMAL HIGH (ref 70–99)

## 2019-07-24 LAB — CBC WITH DIFFERENTIAL/PLATELET
Abs Immature Granulocytes: 0.07 10*3/uL (ref 0.00–0.07)
Basophils Absolute: 0.1 10*3/uL (ref 0.0–0.1)
Basophils Relative: 0 %
Eosinophils Absolute: 0 10*3/uL (ref 0.0–0.5)
Eosinophils Relative: 0 %
HCT: 35.3 % — ABNORMAL LOW (ref 39.0–52.0)
Hemoglobin: 11.4 g/dL — ABNORMAL LOW (ref 13.0–17.0)
Immature Granulocytes: 0 %
Lymphocytes Relative: 14 %
Lymphs Abs: 2.6 10*3/uL (ref 0.7–4.0)
MCH: 28.6 pg (ref 26.0–34.0)
MCHC: 32.3 g/dL (ref 30.0–36.0)
MCV: 88.5 fL (ref 80.0–100.0)
Monocytes Absolute: 1.1 10*3/uL — ABNORMAL HIGH (ref 0.1–1.0)
Monocytes Relative: 6 %
Neutro Abs: 14.7 10*3/uL — ABNORMAL HIGH (ref 1.7–7.7)
Neutrophils Relative %: 80 %
Platelets: 241 10*3/uL (ref 150–400)
RBC: 3.99 MIL/uL — ABNORMAL LOW (ref 4.22–5.81)
RDW: 12.9 % (ref 11.5–15.5)
WBC: 18.6 10*3/uL — ABNORMAL HIGH (ref 4.0–10.5)
nRBC: 0 % (ref 0.0–0.2)

## 2019-07-24 LAB — LACTIC ACID, PLASMA
Lactic Acid, Venous: 2 mmol/L (ref 0.5–1.9)
Lactic Acid, Venous: 2.2 mmol/L (ref 0.5–1.9)

## 2019-07-24 LAB — COMPREHENSIVE METABOLIC PANEL
ALT: 112 U/L — ABNORMAL HIGH (ref 0–44)
AST: 101 U/L — ABNORMAL HIGH (ref 15–41)
Albumin: 3.5 g/dL (ref 3.5–5.0)
Alkaline Phosphatase: 145 U/L — ABNORMAL HIGH (ref 38–126)
Anion gap: 11 (ref 5–15)
BUN: 10 mg/dL (ref 6–20)
CO2: 26 mmol/L (ref 22–32)
Calcium: 8.7 mg/dL — ABNORMAL LOW (ref 8.9–10.3)
Chloride: 101 mmol/L (ref 98–111)
Creatinine, Ser: 0.96 mg/dL (ref 0.61–1.24)
GFR calc Af Amer: >60 mL/min (ref >60)
GFR calc non Af Amer: >60 mL/min (ref >60)
Glucose, Bld: 108 mg/dL — ABNORMAL HIGH (ref 70–99)
Potassium: 3.9 mmol/L (ref 3.5–5.1)
Sodium: 138 mmol/L (ref 135–145)
Total Bilirubin: 0.9 mg/dL (ref 0.3–1.2)
Total Protein: 6.6 g/dL (ref 6.5–8.1)

## 2019-07-24 LAB — URINALYSIS, ROUTINE W REFLEX MICROSCOPIC
Bacteria, UA: NONE SEEN
Bilirubin Urine: NEGATIVE
Glucose, UA: NEGATIVE mg/dL
Ketones, ur: NEGATIVE mg/dL
Leukocytes,Ua: NEGATIVE
Nitrite: NEGATIVE
Protein, ur: NEGATIVE mg/dL
Specific Gravity, Urine: 1.004 — ABNORMAL LOW (ref 1.005–1.030)
pH: 7 (ref 5.0–8.0)

## 2019-07-24 LAB — PHOSPHORUS: Phosphorus: 2.8 mg/dL (ref 2.5–4.6)

## 2019-07-24 LAB — HEPATITIS PANEL, ACUTE
HCV Ab: NONREACTIVE
Hep A IgM: NONREACTIVE
Hep B C IgM: NONREACTIVE
Hepatitis B Surface Ag: NONREACTIVE

## 2019-07-24 LAB — SARS CORONAVIRUS 2 BY RT PCR (HOSPITAL ORDER, PERFORMED IN ~~LOC~~ HOSPITAL LAB): SARS Coronavirus 2: NEGATIVE

## 2019-07-24 LAB — AMMONIA: Ammonia: 44 umol/L — ABNORMAL HIGH (ref 9–35)

## 2019-07-24 LAB — HIV ANTIBODY (ROUTINE TESTING W REFLEX): HIV Screen 4th Generation wRfx: NONREACTIVE

## 2019-07-24 LAB — HEMOGLOBIN A1C
Hgb A1c MFr Bld: 6.2 % — ABNORMAL HIGH (ref 4.8–5.6)
Mean Plasma Glucose: 131.24 mg/dL

## 2019-07-24 LAB — LIPASE, BLOOD: Lipase: 22 U/L (ref 11–51)

## 2019-07-24 LAB — MAGNESIUM: Magnesium: 2.1 mg/dL (ref 1.7–2.4)

## 2019-07-24 LAB — TSH: TSH: 1.093 u[IU]/mL (ref 0.350–4.500)

## 2019-07-24 LAB — MRSA PCR SCREENING: MRSA by PCR: NEGATIVE

## 2019-07-24 MED ORDER — PIPERACILLIN-TAZOBACTAM 3.375 G IVPB 30 MIN
3.3750 g | Freq: Once | INTRAVENOUS | Status: AC
  Administered 2019-07-24: 3.375 g via INTRAVENOUS
  Filled 2019-07-24: qty 50

## 2019-07-24 MED ORDER — INSULIN ASPART 100 UNIT/ML ~~LOC~~ SOLN
2.0000 [IU] | SUBCUTANEOUS | Status: DC
  Filled 2019-07-24: qty 0.06

## 2019-07-24 MED ORDER — CHLORHEXIDINE GLUCONATE CLOTH 2 % EX PADS
6.0000 | MEDICATED_PAD | Freq: Every day | CUTANEOUS | Status: DC
  Administered 2019-07-24: 6 via TOPICAL

## 2019-07-24 MED ORDER — IBUPROFEN 200 MG PO TABS
400.0000 mg | ORAL_TABLET | Freq: Four times a day (QID) | ORAL | Status: DC | PRN
  Administered 2019-07-24: 400 mg via ORAL
  Filled 2019-07-24: qty 2

## 2019-07-24 MED ORDER — HEPARIN SODIUM (PORCINE) 5000 UNIT/ML IJ SOLN
5000.0000 [IU] | Freq: Three times a day (TID) | INTRAMUSCULAR | Status: DC
  Administered 2019-07-24: 5000 [IU] via SUBCUTANEOUS
  Filled 2019-07-24: qty 1

## 2019-07-24 MED ORDER — NALOXONE HCL 4 MG/10ML IJ SOLN
0.0000 mg/h | INTRAVENOUS | Status: DC
  Filled 2019-07-24: qty 10

## 2019-07-24 MED ORDER — ACETAMINOPHEN 325 MG PO TABS
650.0000 mg | ORAL_TABLET | Freq: Four times a day (QID) | ORAL | Status: DC | PRN
  Administered 2019-07-24 (×2): 650 mg via ORAL
  Filled 2019-07-24 (×2): qty 2

## 2019-07-24 MED ORDER — AMOXICILLIN-POT CLAVULANATE 875-125 MG PO TABS
1.0000 | ORAL_TABLET | Freq: Two times a day (BID) | ORAL | Status: DC
  Administered 2019-07-24 – 2019-07-25 (×3): 1 via ORAL
  Filled 2019-07-24 (×3): qty 1

## 2019-07-24 MED ORDER — ORAL CARE MOUTH RINSE
15.0000 mL | Freq: Two times a day (BID) | OROMUCOSAL | Status: DC
  Administered 2019-07-24: 15 mL via OROMUCOSAL

## 2019-07-24 MED ORDER — DOCUSATE SODIUM 100 MG PO CAPS
100.0000 mg | ORAL_CAPSULE | Freq: Two times a day (BID) | ORAL | Status: DC | PRN
  Administered 2019-07-24: 100 mg via ORAL
  Filled 2019-07-24: qty 1

## 2019-07-24 MED ORDER — POLYETHYLENE GLYCOL 3350 17 G PO PACK: 17.0000 g | PACK | Freq: Every day | ORAL | Status: DC | PRN
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
## 2019-07-24 NOTE — Progress Notes (Signed)
eLink Physician-Brief Progress Note Patient Name: Garrett Richardson DOB: Feb 14, 1976 MRN: [id number]   Date of Service  07/24/2019  HPI/Events of Note  Nursing request to titrate Narcan IV infusion.   eICU Interventions  Plan: 1. Will titrate Narcan IV infusion off as tolerated.      Intervention Category Major Interventions: Delirium, psychosis, severe agitation - evaluation and management  Kalyani Maeda Eugene 07/24/2019, 4:12 AM

## 2019-07-24 NOTE — Progress Notes (Signed)
Orthopedic Tech Progress Note Patient Details:  Garrett Richardson 24-Oct-1975 [id number] Applied splint to left hand. Ortho Devices Type of Ortho Device: Ulna gutter splint Ortho Device/Splint Location: ULE Ortho Device/Splint Interventions: Application, Ordered   Post Interventions Patient Tolerated: Well Instructions Provided: Care of device   Garrett Richardson A Yahir Tavano 07/24/2019, 1:08 AM

## 2019-07-25 LAB — GLUCOSE, CAPILLARY: Glucose-Capillary: 110 mg/dL — ABNORMAL HIGH (ref 70–99)

## 2019-07-25 MED ORDER — AMOXICILLIN-POT CLAVULANATE 875-125 MG PO TABS: 1.0000 | ORAL_TABLET | Freq: Two times a day (BID) | ORAL | 0 refills | Status: AC

## 2019-07-25 NOTE — TOC Initial Note (Addendum)
Transition of Care [facility]) - Initial/Assessment Note    Patient Details  Name: Garrett Richardson MRN: [id number] Date of Birth: 08/02/1975  Transition of [facility]) CM/SW Contact:    Golda Acre, RN Phone Number: 07/25/2019, 8:07 AM  Clinical Narrative:                 Admitted with ams and drug overdose states he has not used since last tHursday Iv narcan infuing,UDS-neg. WBC=18.6 and lactic acid 2.0.  Alert and reactive with there narcan drip Patient dcd to home with slef care.  TCT-Home phone etoh and substance abuse resources given to patient over the phone.  Did want to wait for information in the hospital... Information above also emailed to patient and to wife. Expected Discharge Plan: OP Rehab Barriers to Discharge: Continued Medical Work up   Patient Goals and CMS Choice Patient states their goals for this hospitalization and ongoing recovery are:: to go to outpt rehab CMS Medicare.gov Compare Post Acute Care list provided to:: Patient Choice offered to / list presented to : Patient  Expected Discharge Plan and Services Expected Discharge Plan: OP Rehab   Discharge Planning Services: CM Consult   Living arrangements for the past 2 months: Single Family Home                                      Prior Living Arrangements/Services Living arrangements for the past 2 months: Single Family Home Lives with:: Spouse Patient language and need for interpreter reviewed:: No Do you feel safe going back to the place where you live?: Yes      Need for Family Participation in Patient Care: Yes (Comment) Care giver support system in place?: Yes (comment)   Criminal Activity/Legal Involvement Pertinent to Current Situation/Hospitalization: No - Comment as needed  Activities of Daily Living Home Assistive Devices/Equipment: Eyeglasses ADL Screening (condition at time of admission) Patient's cognitive ability adequate to safely complete daily activities?: Yes Is the  patient deaf or have difficulty hearing?: No Does the patient have difficulty seeing, even when wearing glasses/contacts?: No Does the patient have difficulty concentrating, remembering, or making decisions?: No Patient able to express need for assistance with ADLs?: Yes Does the patient have difficulty dressing or bathing?: No Independently performs ADLs?: Yes (appropriate for developmental age) Does the patient have difficulty walking or climbing stairs?: No Weakness of Legs: None Weakness of Arms/Hands: None  Permission Sought/Granted                  Emotional Assessment Appearance:: Appears stated age Attitude/Demeanor/Rapport: Engaged Affect (typically observed): Calm Orientation: : Oriented to Self, Oriented to Place, Oriented to  Time, Oriented to Situation Alcohol / Substance Use: Illicit Drugs, Alcohol Use, Tobacco Use Psych Involvement: No (comment)  Admission diagnosis:  Somnolence [R40.0] Overdose [T50.901A] Acute respiratory failure with hypoxia (HCC) [J96.01] Closed boxer's fracture, initial encounter [S62.339A] Patient Active Problem List   Diagnosis Date Noted  . Overdose 07/24/2019  . Acute respiratory failure with hypoxia (HCC)   . Somnolence   . Acute encephalopathy   . 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 Spinal cord injury, cervical region [facility]) 03/06/2018   PCP:  [facility]:   [facility] [address], [place] [address] [place] [address] Phone: [phone] Fax: [phone]     Social Determinants of Health (SDOH) Interventions  Readmission Risk Interventions No flowsheet data found.

## 2019-07-25 NOTE — Discharge Summary (Signed)
Physician Discharge Summary         Patient ID: Garrett Richardson MRN: [id number] DOB/AGE: 06/29/1975 44 y.o.  Admit date: 07/23/2019 Discharge date: 07/25/2019  Discharge Diagnoses:    Acute toxic and metabolic Encephalopathy  Left fifth metacarpal fracture Chronic pain  Elevated transaminases  Leukocytosis  Sinusitis    Discharge summary     44 year old male patient with a prior history of CVA, spinal stenosis requiring spinal decompression, closed reduction nasal fracture and polysubstance abuse.  This included alcohol, opiates, methamphetamines and cocaine.  Presented to the emergency room accompanied by his spouse.  Apparently his spouse had picked him up from a methamphetamine house a couple days prior.  Is unclear what other drugs he was taking. When she initially got him home she gave him intramuscular Narcan and called EMS.  On EMS arrival he was given additional Narcan which she responded to, he then was started on Narcan infusion on presentation to the emergency room.  He denied using any illegal drugs, felt he had been going through withdrawal since several days prior.  His urinary drug screen was negative.  Of note he would become progressively somnolent anytime the Narcan drip was turned off.  He was admitted with presumptive diagnosis of narcotic overdose. He was admitted to ICU treated therapeutically with IVFs, supplemental oxygen, pulse ox, end tidal CO2 monitoring and narcan infusion. His narcan infusion was turned off shortly after admission to ICU. He was monitored over night and deemed safe for dc as of 6/3. He was given out patient resources for substance abuse at his request. Prescribed augmentin for sinusitis felt likely 2/2 methamphetamines and verified on CT imaging. And discharged to home w/ plan to see his orthopedist at [facility] who he had seen in the past.     Discharge Plan by Active Problems     Acute toxic/metabolic encephalopathy -resolved. Presumed narcotic  induced. Resolved Plan Home Was given resources for out pt rehab  Left fifth metacarpal fracture. Plan Will follow with his orthopedist   Leukocytosis w/ sinusitis  Plan Home on augmentin    Significant Hospital tests/ studies   Procedures    Culture data/antimicrobials      Consults    none   Discharge Exam: Blood Pressure (Abnormal) 131/93 (BP Location: Right Arm)   Pulse 80   Temperature 98.6 F (37 C) (Oral)   Respiration 16   Height 5\' 7"  (1.702 m)   Weight 64.3 kg   Oxygen Saturation 99%   Body Mass Index 22.20 kg/m   General: This is a 43 year old black male sitting up in bed he is in no acute distress today HEENT normocephalic atraumatic no jugular venous distention is appreciated Pulmonary: Clear to auscultation Cardiac: Regular rate and rhythm Abdomen: Soft nontender no organomegaly Extremities: Warm dry brisk cap refill.  His left hand is in a cast, CMS is intact. Neuro: Awake oriented no focal deficits GU: Voiding spontaneously. Labs at discharge   Lab Results  Component Value Date   CREATININE 0.96 07/24/2019   BUN 10 07/24/2019   NA 138 07/24/2019   K 3.9 07/24/2019   CL 101 07/24/2019   CO2 26 07/24/2019   Lab Results  Component Value Date   WBC 18.6 (H) 07/24/2019   HGB 11.4 (L) 07/24/2019   HCT 35.3 (L) 07/24/2019   MCV 88.5 07/24/2019   PLT 241 07/24/2019   Lab Results  Component Value Date   ALT 112 (H) 07/24/2019   AST 101 (H) 07/24/2019  ALKPHOS 145 (H) 07/24/2019   BILITOT 0.9 07/24/2019   Lab Results  Component Value Date   INR 0.98 03/06/2018   INR 0.92 02/24/2018    Current radiological studies    CT HEAD WO CONTRAST  Result Date: 07/24/2019 CLINICAL DATA:  Initial evaluation for acute altered mental status. Drug overdose. EXAM: CT HEAD WITHOUT CONTRAST TECHNIQUE: Contiguous axial images were obtained from the base of the skull through the vertex without intravenous contrast. COMPARISON:  Prior CT from  03/19/2018. FINDINGS: Brain: Cerebral volume within normal limits for patient age. No evidence for acute intracranial hemorrhage. No findings to suggest acute large vessel territory infarct. No mass lesion, midline shift, or mass effect. Ventricles are normal in size without evidence for hydrocephalus. No extra-axial fluid collection identified. Vascular: No hyperdense vessel identified. Skull: Scalp soft tissues demonstrate no acute abnormality. Calvarium intact. Sinuses/Orbits: Globes and orbital soft tissues within normal limits. Pain on sinusitis with scattered air-fluid levels noted throughout the paranasal sinuses. Mastoid air cells are clear. IMPRESSION: 1. Negative head CT. No acute intracranial abnormality identified. 2. Acute pansinusitis, which could be either infectious or inflammatory in nature. Electronically Signed   By: Rise Mu M.D.   On: 07/24/2019 04:00   DG Chest Portable 1 View  Result Date: 07/23/2019 CLINICAL DATA:  Hypoxia, possible overdose EXAM: PORTABLE CHEST 1 VIEW COMPARISON:  03/01/2019 FINDINGS: Cardiac shadow is within normal limits. The lungs are clear. No acute bony abnormality is noted. Degenerative changes of the acromioclavicular joints are noted. IMPRESSION: No active disease. Electronically Signed   By: Alcide Clever M.D.   On: 07/23/2019 21:02   DG Hand Complete Left  Result Date: 07/23/2019 CLINICAL DATA:  Possible overdose EXAM: LEFT HAND - COMPLETE 3+ VIEW COMPARISON:  None. FINDINGS: Comminuted fracture of the distal aspect of the fifth metacarpal is noted with angulation anteriorly. No other fractures are seen. Soft tissue swelling is noted. IMPRESSION: Mildly angulated fifth metacarpal fracture distally. Electronically Signed   By: Alcide Clever M.D.   On: 07/23/2019 21:01    Disposition:    Discharge disposition: 01-Home or Self Care       Discharge Instructions    Diet - low sodium heart healthy   Complete by: As directed    Increase  activity slowly   Complete by: As directed       Allergies as of 07/25/2019    Allergen Reactions Comment   Morphine And Related Itching Tolerates Dilaudid and PO oxycodone   Banana     Bee Venom        Medication List    Take these medications   amoxicillin-clavulanate 875-125 MG tablet Commonly known as: AUGMENTIN Take 1 tablet by mouth every 12 (twelve) hours.        Follow-up appointment   PRN Discharge Condition:    good  Physician Statement:   The Patient was personally examined, the discharge assessment and plan has been personally reviewed and I agree with ACNP Mekaylah Klich's assessment and plan. 32  minutes of time have been dedicated to discharge assessment, planning and discharge instructions.   Signed: Shelby Mattocks 07/25/2019, 10:38 AM Simonne Martinet ACNP-BC [facility] Pulmonary/Critical Care Pager # [phone] OR # [phone] if no answer

## 2019-07-25 NOTE — Progress Notes (Addendum)
Discharge information provided to patient and his wife at bedside. Patient and his wife verbalized understanding, no questions asked.  PIV removed. Verified that patient's belongings were all packed and going with patient.  Patient and his wife stated that they had to leave because patient's wife had a doctor's appointment at 10am. Patient had not received Substance Abuse Couseling/Education. Rhonda D. made aware but unable to come to bedside at the moment as she was in a meeting. Bjorn Loser stated that she would reach out to a coworker that could provide education now. This information was communicated to patient and patient's wife, however, they verbalized importance of them leaving now, and stated they were unable to wait for education. Patient stated that he would appreciate someone calling him at [phone].

## 2019-07-29 LAB — CULTURE, BLOOD (ROUTINE X 2)
Culture: NO GROWTH
Culture: NO GROWTH
Special Requests: ADEQUATE
Special Requests: ADEQUATE

## 2019-08-01 ENCOUNTER — Other Ambulatory Visit: Payer: Self-pay

## 2019-08-01 ENCOUNTER — Ambulatory Visit (HOSPITAL_COMMUNITY): Payer: Medicaid Other | Admitting: Licensed Clinical Social Worker

## 2019-08-01 NOTE — Progress Notes (Signed)
SW attempted to send links to virtual chat x 3. 1. At 0855 2. At 0900 and 3. At 0905. SW  f/u via phone but VM box was not setup. Pt will need to reschedule for a later date to complete full assessment. SW stayed in virtual chat for 15 before signing off.

## 2019-08-14 IMAGING — CT CT CERVICAL SPINE W/ CM
3 of 4 series · 12 of 33 positions shown, 14 images · IV contrast (iopamidol)
Comparison: CT 03/06/2018, MRI 02/25/2018

CLINICAL DATA: Neck pain recent surgery

EXAM:
CT CERVICAL SPINE WITH CONTRAST
TECHNIQUE: Multidetector CT imaging of the cervical spine was performed during
intravenous contrast administration. Multiplanar CT image
reconstructions were also generated.
CONTRAST:  100mL XPL8GB-MBB IOPAMIDOL (XPL8GB-MBB) INJECTION 61%

[Series 7: orthogonal bone · axial · 0.30mm/px · z∈[-328,-175]mm · 4 of 123 slices shown, 5 images]
[im 18/123  soft-tissue]
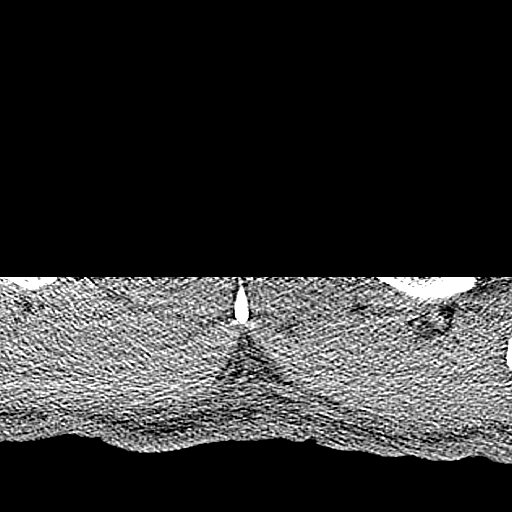
[im 18/123  bone]
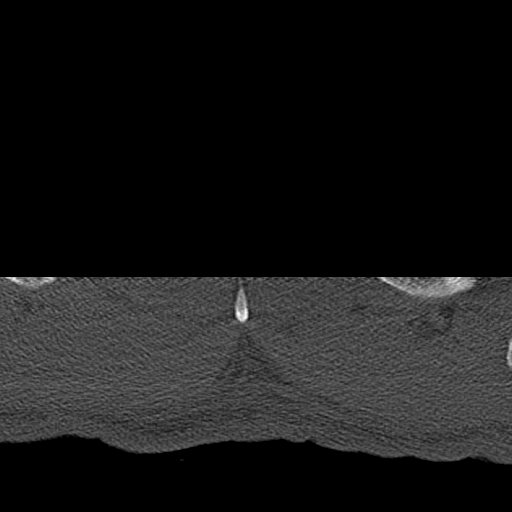
[im 53/123  bone]
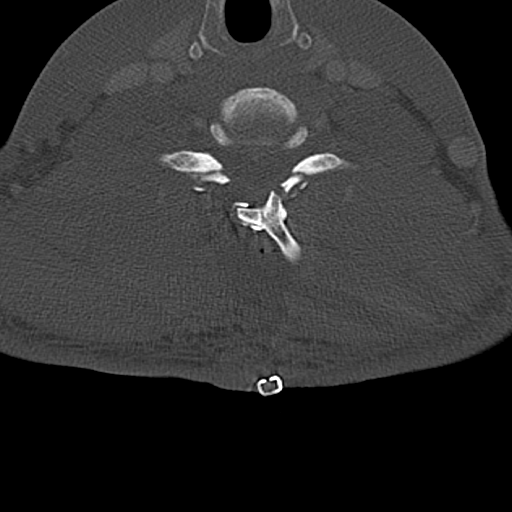
[im 70/123  bone]
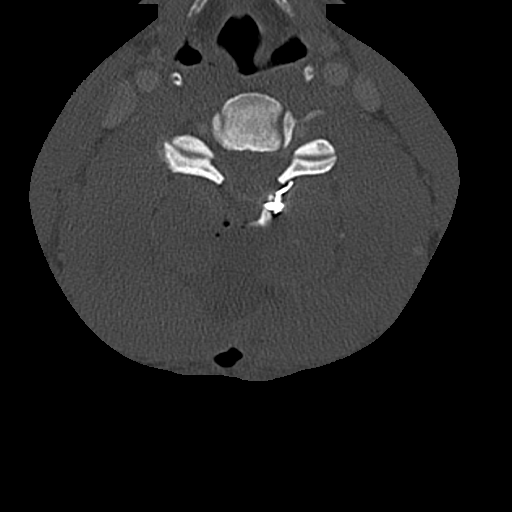
[im 105/123  bone]
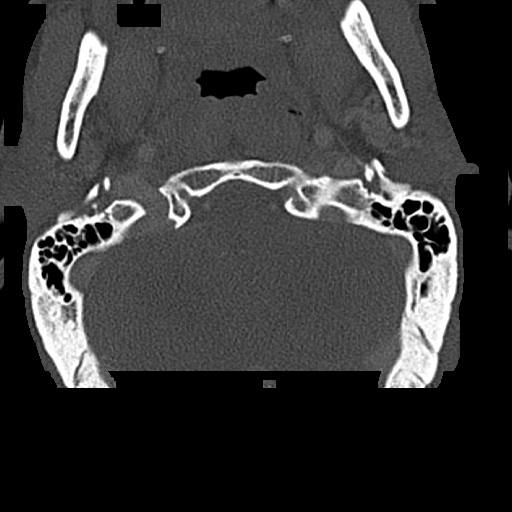

[Series 8: coronal bone · coronal · 0.24mm/px · 3 of 77 slices shown]
[im 21/77  bone]
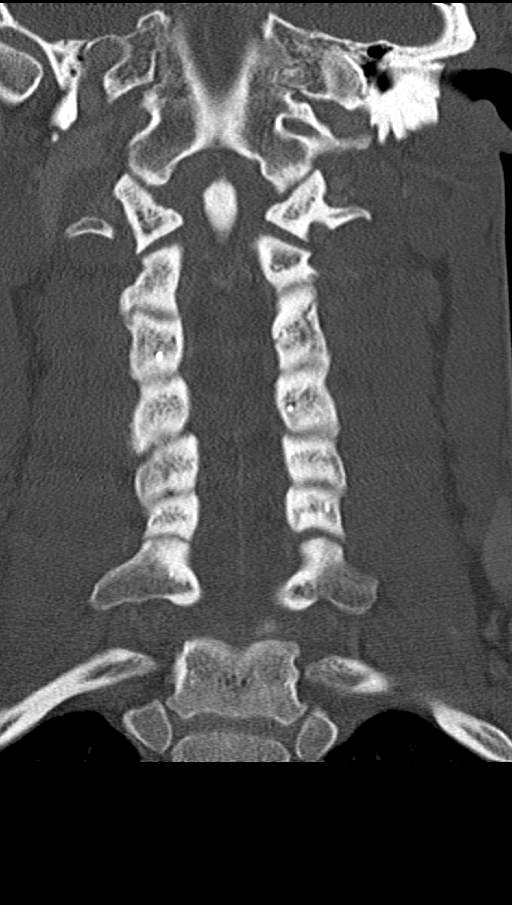
[im 33/77  bone]
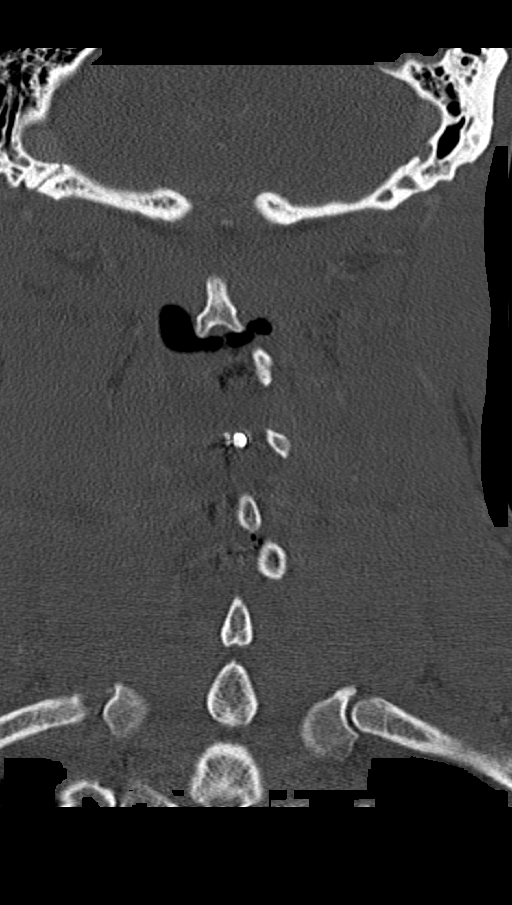
[im 45/77  bone]
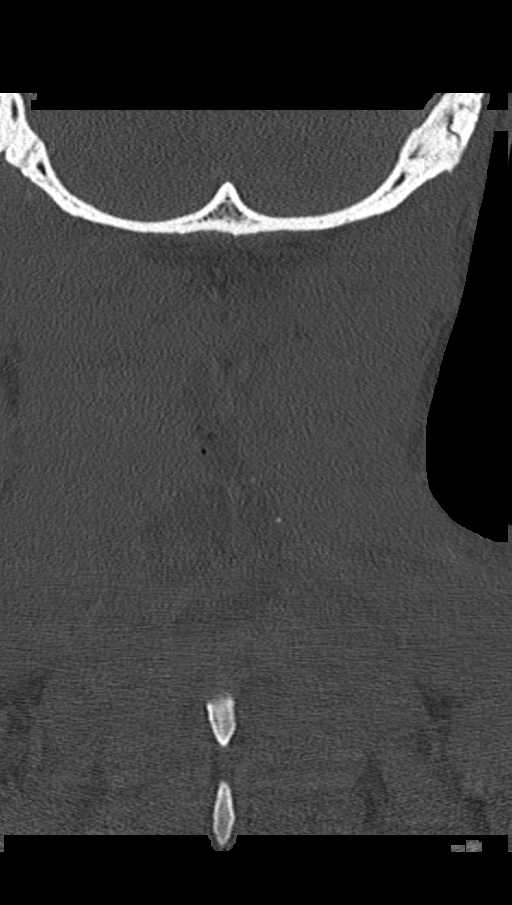

[Series 9: sagittal bone · sagittal · 0.28mm/px · 5 of 61 slices shown, 6 images]
[im 21/61  bone]
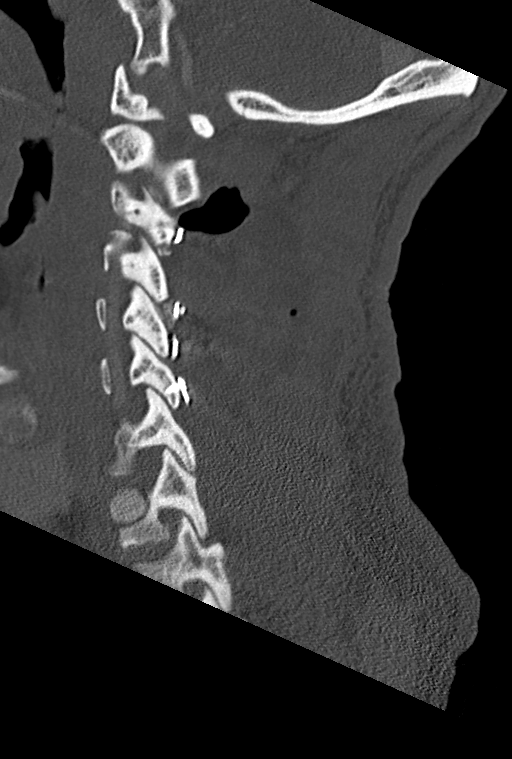
[im 26/61  bone]
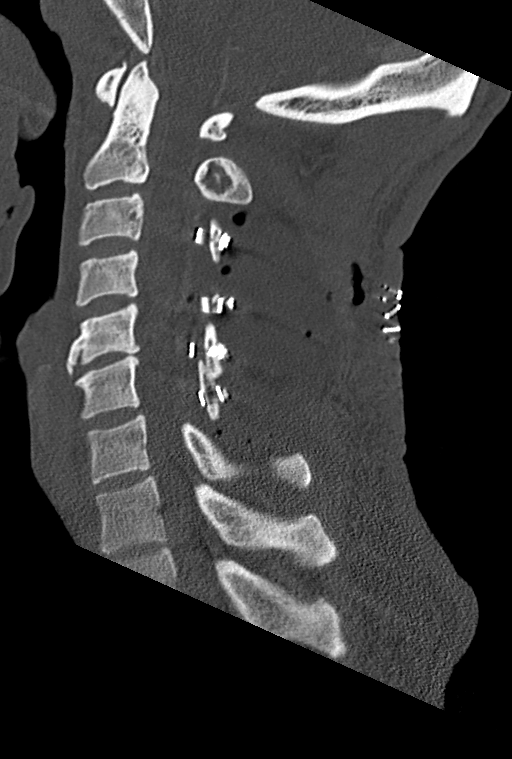
[im 31/61  soft-tissue]
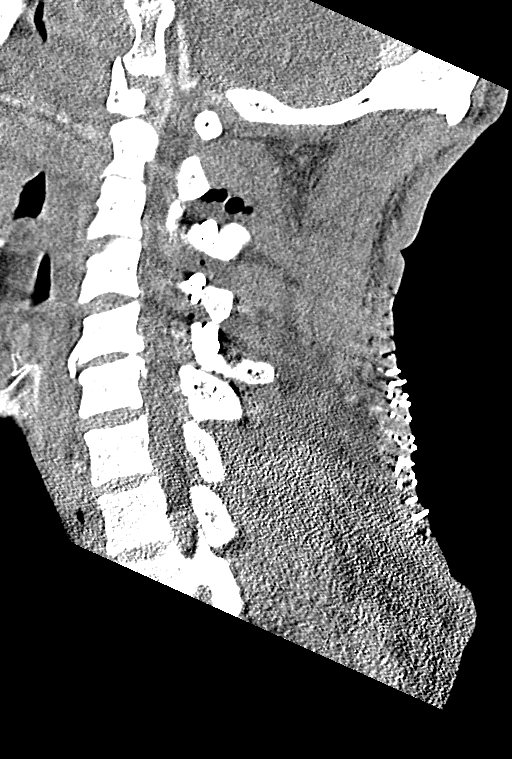
[im 31/61  bone]
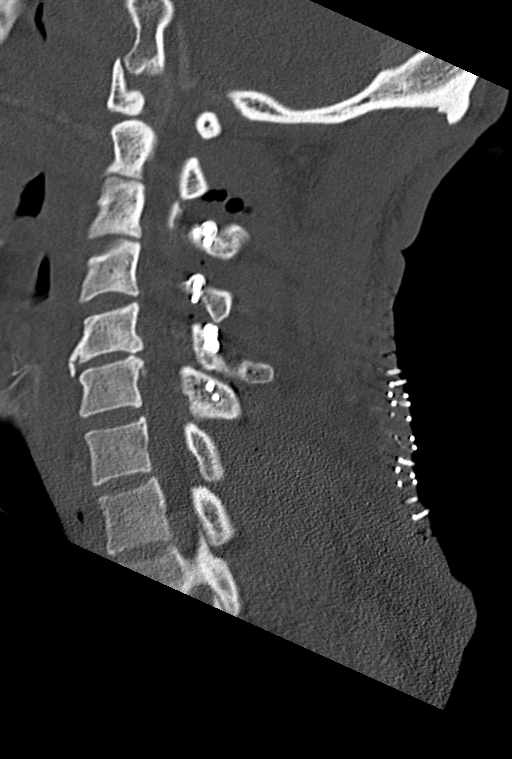
[im 36/61  bone]
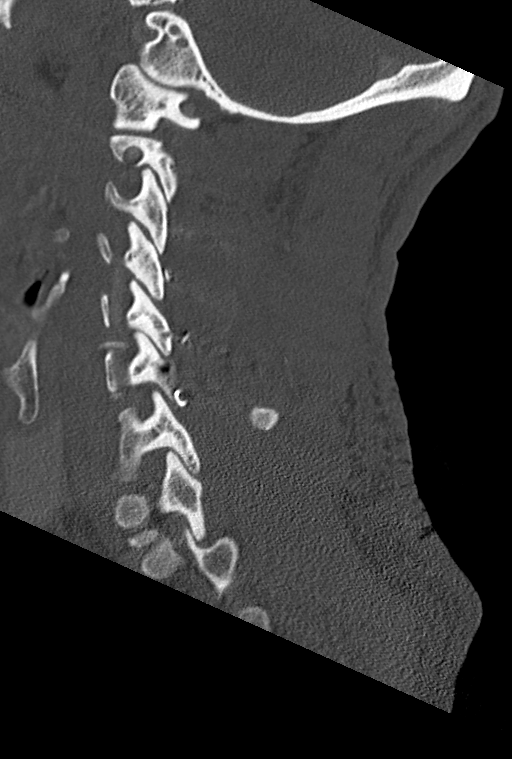
[im 41/61  bone]
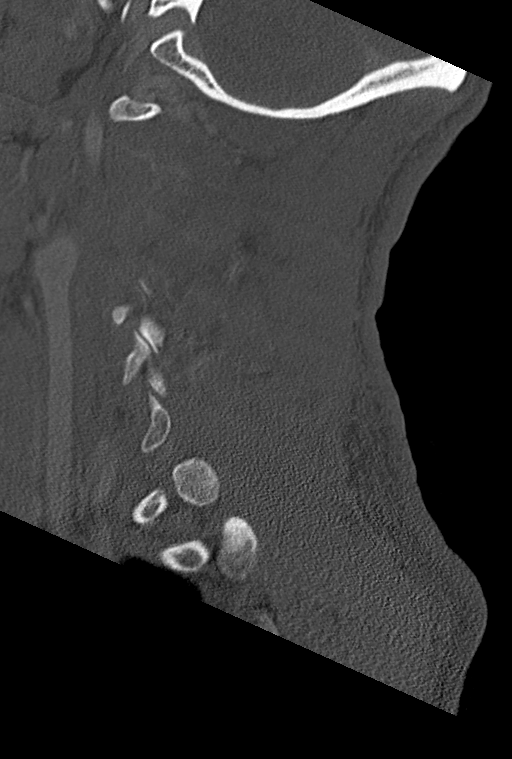

[12 of 33 positions shown; findings below may reference images not displayed]

FINDINGS: Alignment: Straightening of the cervical spine.  No subluxation.

Skull base and vertebrae: Craniovertebral junction is intact. There
is no acute fracture identified. Vertebral body heights are
maintained. Circumscribed lucent lesion in the spinous process of C2
again noted.

Soft tissues and spinal canal: Posterior cutaneous staples. Large
posterior midline fluid collection containing large amount of gas,
this extends from C2 to around the spinous process at C7. Collection
measures 2.4 cm maximum transverse by 3.5 cm maximum AP by 6.6 cm
craniocaudad. Edema within the subcutaneous soft tissues of the
posterior neck.

Disc levels: Mild degenerative changes at C5-C6. Calcifications
posterior to C6. Interval laminoplasty changes C3 through C6 with
cerclage wires and multiple small fixating screws. Left fixating
screw at C5 is only partially anchored within the C5 facet. Linear
defect within the left facet at C6 without anchoring screw. Cephalad
fixating screw on the right at C5 also does not appear anchored
within the bone.

Upper chest: Lung apices are clear.  Thyroid gland is normal.

Other: None
IMPRESSION: 1. Posterior laminoplasty changes from C3 through C6. Large
posterior midline gas and fluid collection measuring 2.4 x 3.5 x
cm overlying the surgical site. Amount of gas is somewhat more than
would be expected for routine postoperative gas.
2. Posterior wires and fixating screws with left screws at C5 and C6
and right screw at C5 not anchored within the bone.

## 2019-09-04 ENCOUNTER — Ambulatory Visit (INDEPENDENT_AMBULATORY_CARE_PROVIDER_SITE_OTHER): Payer: Medicaid Other | Admitting: Licensed Clinical Social Worker

## 2019-09-04 ENCOUNTER — Other Ambulatory Visit: Payer: Self-pay

## 2019-09-04 ENCOUNTER — Encounter (HOSPITAL_COMMUNITY): Payer: Self-pay | Admitting: Licensed Clinical Social Worker

## 2019-09-04 DIAGNOSIS — F1910 Other psychoactive substance abuse, uncomplicated: Secondary | ICD-10-CM

## 2019-09-04 DIAGNOSIS — F411 Generalized anxiety disorder: Secondary | ICD-10-CM

## 2019-09-04 DIAGNOSIS — F19980 Other psychoactive substance use, unspecified with psychoactive substance-induced anxiety disorder: Secondary | ICD-10-CM
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
## 2019-09-30 ENCOUNTER — Ambulatory Visit (INDEPENDENT_AMBULATORY_CARE_PROVIDER_SITE_OTHER): Payer: Medicaid Other | Admitting: Licensed Clinical Social Worker

## 2019-09-30 ENCOUNTER — Other Ambulatory Visit: Payer: Self-pay

## 2019-09-30 DIAGNOSIS — F15980 Other stimulant use, unspecified with stimulant-induced anxiety disorder: Secondary | ICD-10-CM

## 2019-09-30 DIAGNOSIS — F331 Major depressive disorder, recurrent, moderate: Secondary | ICD-10-CM

## 2019-09-30 DIAGNOSIS — F411 Generalized anxiety disorder: Secondary | ICD-10-CM

## 2019-09-30 NOTE — Progress Notes (Signed)
   THERAPIST PROGRESS NOTE  Virtual Visit via Video Note  I connected with Garrett Richardson on 09/30/19 at  2:00 PM EDT by a video enabled telemedicine application and verified that I am speaking with the correct person using two identifiers.  Location: Patient: [facility]  Provider: [facility]    I discussed the limitations of evaluation and management by telemedicine and the availability of in person appointments. The patient expressed understanding and agreed to proceed.   Therapist Response:   Subjective/Objective: Garrett Richardson was alert and oriented x 5. He was dressed casually with good eye contact. Pt engaged well throughout assessment and was anxious/depressed mood. Pt started out session by asking about medications due to his consistent mood swings. LCSW had previously spoke with pt about medication management, but at the time pt was not ready. Garrett Richardson has spoken with his wife and they both agree that medication is needed. Currently he is oversleeping upwards of 18 hours per day with break to eat and use the restroom. Pt is still able to get things done at work when needed, but usually has little to no energy doing it or becomes very irritable. Garrett Richardson has been utilizing drugs as primary coping mechanism cocaine (1-2 time per week) and marijuana daily.   Assessment/plan: Pt endorses symptoms for irritability, moods swings, sadness, worthless, fatigue, oversleeping, worry & tension. Currently pt now meets criteria for MDD and GAD. He will call number provided to get a medication management appointment moving forward. Plan by LCSW will be to start medication 2 times per week utilizing the video "Daily Calm" & then follow up with LCSW every two weeks.     I discussed the assessment and treatment plan with the patient. The patient was provided an opportunity to ask questions and all were answered. The patient agreed with the plan and demonstrated an understanding of the instructions.   The  patient was advised to call back or seek an in-person evaluation if the symptoms worsen or if the condition fails to improve as anticipated.  I provided 45 minutes of non-face-to-face time during this encounter.   Weber Cooks, LCSW   Participation Level: Active  Behavioral Response: CasualAlertAnxious and Depressed  Type of Therapy: Individual Therapy  Treatment Goals addressed: Diagnosis: anxiety  Interventions: CBT and Supportive  Summary: Garrett Richardson is a 44 y.o. male who presents with Anxiety and depression.   Suicidal/Homicidal: NAwithout intent/plan   Plan: Return again in 2 weeks.  Diagnosis: Axis I: Generalized Anxiety Disorder and Major Depression, Recurrent severe   Weber Cooks, LCSW 09/30/2019

## 2019-10-14 ENCOUNTER — Other Ambulatory Visit: Payer: Self-pay

## 2019-10-14 ENCOUNTER — Ambulatory Visit (HOSPITAL_COMMUNITY): Payer: Self-pay | Admitting: Licensed Clinical Social Worker

## 2019-10-29 ENCOUNTER — Telehealth (HOSPITAL_COMMUNITY): Payer: Self-pay | Admitting: Licensed Clinical Social Worker

## 2019-10-29 ENCOUNTER — Ambulatory Visit (HOSPITAL_COMMUNITY): Payer: Self-pay | Admitting: Licensed Clinical Social Worker

## 2019-10-29 ENCOUNTER — Other Ambulatory Visit: Payer: Self-pay

## 2019-10-29 NOTE — Telephone Encounter (Signed)
LCSW sent two links and f/u with a PC to pt between 2pm and 2:15.  Pt did not answer PC and a voicemail was left for pt. This is pt 2nd no show in a row. LCSW will Cancel appointments for pt going into October but keep his last remaining September 29th appointment with LCSW. This will give pt an opportunity to rework appointments that work for him.

## 2019-11-18 ENCOUNTER — Other Ambulatory Visit: Payer: Self-pay

## 2019-11-18 ENCOUNTER — Telehealth (HOSPITAL_COMMUNITY): Payer: Self-pay | Admitting: Psychiatry

## 2019-11-20 ENCOUNTER — Other Ambulatory Visit: Payer: Self-pay

## 2019-11-20 ENCOUNTER — Telehealth (HOSPITAL_COMMUNITY): Payer: Self-pay | Admitting: Licensed Clinical Social Worker

## 2019-11-20 NOTE — Telephone Encounter (Signed)
LCSW sent two links and called once for 10 am appt. No VM was as phone kept ringing. Pt to be marked as a no show.

## 2019-11-27 ENCOUNTER — Encounter (HOSPITAL_COMMUNITY): Payer: Self-pay | Admitting: *Deleted

## 2019-11-27 ENCOUNTER — Emergency Department (HOSPITAL_COMMUNITY): Payer: Medicaid Other

## 2019-11-27 ENCOUNTER — Emergency Department (HOSPITAL_COMMUNITY)
Admission: EM | Admit: 2019-11-27 | Discharge: 2019-11-28 | Disposition: A | Discharge status: Home / Self Care | Payer: Medicaid Other | Attending: Emergency Medicine | Admitting: Emergency Medicine

## 2019-11-27 ENCOUNTER — Other Ambulatory Visit: Payer: Self-pay

## 2019-11-27 DIAGNOSIS — F17210 Nicotine dependence, cigarettes, uncomplicated: Secondary | ICD-10-CM | POA: Diagnosis not present

## 2019-11-27 DIAGNOSIS — F1910 Other psychoactive substance abuse, uncomplicated: Secondary | ICD-10-CM | POA: Diagnosis not present

## 2019-11-27 DIAGNOSIS — R4182 Altered mental status, unspecified: Secondary | ICD-10-CM | POA: Diagnosis present

## 2019-11-27 DIAGNOSIS — F1290 Cannabis use, unspecified, uncomplicated: Secondary | ICD-10-CM | POA: Diagnosis not present

## 2019-11-27 DIAGNOSIS — R404 Transient alteration of awareness: Secondary | ICD-10-CM

## 2019-11-27 LAB — ETHANOL: Alcohol, Ethyl (B): <10 mg/dL (ref <10)

## 2019-11-27 LAB — CBC WITH DIFFERENTIAL/PLATELET
Abs Immature Granulocytes: 0.06 10*3/uL (ref 0.00–0.07)
Basophils Absolute: 0.1 10*3/uL (ref 0.0–0.1)
Basophils Relative: 1 %
Eosinophils Absolute: 0.9 10*3/uL — ABNORMAL HIGH (ref 0.0–0.5)
Eosinophils Relative: 7 %
HCT: 38.8 % — ABNORMAL LOW (ref 39.0–52.0)
Hemoglobin: 12.3 g/dL — ABNORMAL LOW (ref 13.0–17.0)
Immature Granulocytes: 0 %
Lymphocytes Relative: 23 %
Lymphs Abs: 3.2 10*3/uL (ref 0.7–4.0)
MCH: 27.6 pg (ref 26.0–34.0)
MCHC: 31.7 g/dL (ref 30.0–36.0)
MCV: 87.2 fL (ref 80.0–100.0)
Monocytes Absolute: 0.7 10*3/uL (ref 0.1–1.0)
Monocytes Relative: 5 %
Neutro Abs: 9 10*3/uL — ABNORMAL HIGH (ref 1.7–7.7)
Neutrophils Relative %: 64 %
Platelets: 318 10*3/uL (ref 150–400)
RBC: 4.45 MIL/uL (ref 4.22–5.81)
RDW: 12.4 % (ref 11.5–15.5)
WBC: 14 10*3/uL — ABNORMAL HIGH (ref 4.0–10.5)
nRBC: 0 % (ref 0.0–0.2)

## 2019-11-27 LAB — URINALYSIS, ROUTINE W REFLEX MICROSCOPIC
Bacteria, UA: NONE SEEN
Bilirubin Urine: NEGATIVE
Glucose, UA: NEGATIVE mg/dL
Hgb urine dipstick: NEGATIVE
Ketones, ur: NEGATIVE mg/dL
Nitrite: NEGATIVE
Protein, ur: NEGATIVE mg/dL
Specific Gravity, Urine: 1.027 (ref 1.005–1.030)
pH: 5 (ref 5.0–8.0)

## 2019-11-27 LAB — COMPREHENSIVE METABOLIC PANEL
ALT: 18 U/L (ref 0–44)
AST: 23 U/L (ref 15–41)
Albumin: 4.1 g/dL (ref 3.5–5.0)
Alkaline Phosphatase: 79 U/L (ref 38–126)
Anion gap: 9 (ref 5–15)
BUN: 20 mg/dL (ref 6–20)
CO2: 26 mmol/L (ref 22–32)
Calcium: 9.2 mg/dL (ref 8.9–10.3)
Chloride: 103 mmol/L (ref 98–111)
Creatinine, Ser: 1.36 mg/dL — ABNORMAL HIGH (ref 0.61–1.24)
GFR calc non Af Amer: >60 mL/min (ref >60)
Glucose, Bld: 172 mg/dL — ABNORMAL HIGH (ref 70–99)
Potassium: 3.9 mmol/L (ref 3.5–5.1)
Sodium: 138 mmol/L (ref 135–145)
Total Bilirubin: 1.1 mg/dL (ref 0.3–1.2)
Total Protein: 7.2 g/dL (ref 6.5–8.1)

## 2019-11-27 LAB — I-STAT VENOUS BLOOD GAS, ED
Acid-Base Excess: 2 mmol/L (ref 0.0–2.0)
Bicarbonate: 28.4 mmol/L — ABNORMAL HIGH (ref 20.0–28.0)
Calcium, Ion: 1.2 mmol/L (ref 1.15–1.40)
HCT: 38 % — ABNORMAL LOW (ref 39.0–52.0)
Hemoglobin: 12.9 g/dL — ABNORMAL LOW (ref 13.0–17.0)
O2 Saturation: 88 %
Potassium: 4.2 mmol/L (ref 3.5–5.1)
Sodium: 140 mmol/L (ref 135–145)
TCO2: 30 mmol/L (ref 22–32)
pCO2, Ven: 51.5 mmHg (ref 44.0–60.0)
pH, Ven: 7.35 (ref 7.250–7.430)
pO2, Ven: 59 mmHg — ABNORMAL HIGH (ref 32.0–45.0)

## 2019-11-27 LAB — ACETAMINOPHEN LEVEL: Acetaminophen (Tylenol), Serum: <10 ug/mL — ABNORMAL LOW (ref 10–30)

## 2019-11-27 MED ORDER — SODIUM CHLORIDE 0.9 % IV BOLUS
500.0000 mL | Freq: Once | INTRAVENOUS | Status: AC
  Administered 2019-11-27: 500 mL via INTRAVENOUS

## 2019-11-27 NOTE — Discharge Instructions (Addendum)
Please call your primary doctor in the morning to schedule a follow up appointment in the coming week. Return to the ED with any new or suddenly worsening symptoms.

## 2019-11-27 NOTE — ED Notes (Signed)
The pt is fast asleep

## 2019-11-27 NOTE — ED Triage Notes (Signed)
Pt here via [facility] for possible drug overdose.  Family stated he went out to mow the lawn, mowed a bit, and then walked back in the house, dozing off.  Pt's sats were in 80's with pinpoint pupils.  Hx of opiate abuse, although he went into cardiac arrest in June and his UDS was neg.  Pt alert and able to answer questions.  Sats of 95 on 3L.  CBG was 178.

## 2019-11-27 NOTE — ED Provider Notes (Signed)
[facility] EMERGENCY DEPARTMENT Provider Note   CSN: [id number] Arrival date & time: 11/27/19  1839     History Chief Complaint  Patient presents with  . Altered Mental Status    possible overdose    Garrett Richardson is a 44 y.o. male.  The history is provided by the patient, the EMS personnel and medical records. No language interpreter was used.  Altered Mental Status  Garrett Richardson is a 44 y.o. male who presents to the Emergency Department complaining of altered mental status. Level V caveat due to altered mental status. He presents the emergency department by EMS for evaluation of altered mental status. He was in his routine state of health when he went outside to [place] the lawn. When he returned to the home he was confused and lethargic for family members. On EMS arrival he was found to be hypoxic, lethargic and altered. He was treated with supplemental oxygen. Narcan was not administered due to spontaneous ventilations. Patient denies any chest pain or difficulty breathing. He does state that he smokes marijuana earlier today. He has no additional complaints.   he does have a history of prior overdose and cardiac arrest.  Past Medical History:  Diagnosis Date  . Spondylolysis     Patient Active Problem List   Diagnosis Date Noted  . Overdose 07/24/2019  . Acute respiratory failure with hypoxia (HCC)   . Somnolence   . Acute encephalopathy   . 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 Spinal cord injury, cervical region [facility]) 03/06/2018    Past Surgical History:  Procedure Laterality Date  . BACK SURGERY     neck  . POSTERIOR CERVICAL FUSION/FORAMINOTOMY N/A 03/07/2018   Procedure: POSTERIOR CERVICAL LAMINAPLASTY CERVICAL THREE-CERVICAL SIX;  Surgeon: [name], MD;  Location: [facility] OR;  Service: Neurosurgery;  Laterality: N/A;  POSTERIOR CERVICAL LAMINAPLASTY CERVICAL THREE-CERVICAL SIX       Family History  Problem Relation Age  of Onset  . Diabetes Mother     Social History   Tobacco Use  . Smoking status: Current Every Day Smoker    Packs/day: 2.00    Types: Cigarettes  . Smokeless tobacco: Current User  Vaping Use  . Vaping Use: Never used  Substance Use Topics  . Alcohol use: Not Currently  . Drug use: Not Currently    Types: Other-see comments, Cocaine    Comment: 1 month ago marijuana     Home Medications Prior to Admission medications   Medication Sig Start Date End Date Taking? Authorizing Provider  amoxicillin-clavulanate (AUGMENTIN) 875-125 MG tablet Take 1 tablet by mouth every 12 (twelve) hours. 07/25/19   Simonne Martinet, NP    Allergies    Morphine and related, Banana, and Bee venom  Review of Systems   Review of Systems  All other systems reviewed and are negative.   Physical Exam Updated Vital Signs BP 121/78   Pulse 79   Temp 97.6 F (36.4 C) (Oral)   Resp (!) 8   Ht 5\' 7"  (1.702 m)   Wt 64.3 kg   SpO2 100%   BMI 22.20 kg/m   Physical Exam Vitals and nursing note reviewed.  Constitutional:      Appearance: He is well-developed.     Comments: Lethargic  HENT:     Head: Normocephalic and atraumatic.  Cardiovascular:     Rate and Rhythm: Normal rate and regular rhythm.     Heart sounds: No murmur heard.  Pulmonary:     Effort: Pulmonary effort is normal. No respiratory distress.     Breath sounds: Normal breath sounds.  Abdominal:     Palpations: Abdomen is soft.     Tenderness: There is no abdominal tenderness. There is no guarding or rebound.  Musculoskeletal:        General: No tenderness.  Skin:    General: Skin is warm and dry.  Neurological:     Comments: Drowsy. Arouses to verbal stimuli. Pupils pinpoint and reactive bilaterally. Generalized weakness.  Psychiatric:     Comments: Unable to assess     ED Results / Procedures / Treatments   Labs (all labs ordered are listed, but only abnormal results are displayed) Labs Reviewed  ACETAMINOPHEN  LEVEL - Abnormal; Notable for the following components:      Result Value   Acetaminophen (Tylenol), Serum <10 (*)    All other components within normal limits  COMPREHENSIVE METABOLIC PANEL - Abnormal; Notable for the following components:   Glucose, Bld 172 (*)    Creatinine, Ser 1.36 (*)    All other components within normal limits  CBC WITH DIFFERENTIAL/PLATELET - Abnormal; Notable for the following components:   WBC 14.0 (*)    Hemoglobin 12.3 (*)    HCT 38.8 (*)    Neutro Abs 9.0 (*)    Eosinophils Absolute 0.9 (*)    All other components within normal limits  I-STAT VENOUS BLOOD GAS, ED - Abnormal; Notable for the following components:   pO2, Ven 59.0 (*)    Bicarbonate 28.4 (*)    HCT 38.0 (*)    Hemoglobin 12.9 (*)    All other components within normal limits  ETHANOL  URINALYSIS, ROUTINE W REFLEX MICROSCOPIC  RAPID URINE DRUG SCREEN, HOSP PERFORMED    EKG None  Radiology CT Head Wo Contrast  Result Date: 11/27/2019 CLINICAL DATA:  Mental status change. EXAM: CT HEAD WITHOUT CONTRAST TECHNIQUE: Contiguous axial images were obtained from the base of the skull through the vertex without intravenous contrast. COMPARISON:  Head CT 07/24/2019 FINDINGS: Brain: No intracranial hemorrhage, mass effect, or midline shift. No hydrocephalus. The basilar cisterns are patent. No evidence of territorial infarct or acute ischemia. No extra-axial or intracranial fluid collection. Vascular: No hyperdense vessel or unexpected calcification. Skull: No fracture or focal lesion. Sinuses/Orbits: Improved opacification of the paranasal sinuses with mild residual mucosal thickening of the ethmoid air cells. Small mucous retention cyst in the right maxillary sinus. The mastoid air cells are clear. Orbits unremarkable. Other: None. IMPRESSION: 1. No acute intracranial abnormality. 2. Improved paranasal sinus disease. Electronically Signed   By: Narda Rutherford M.D.   On: 11/27/2019 20:54   DG Chest  Port 1 View  Result Date: 11/27/2019 CLINICAL DATA:  Change in mental status EXAM: PORTABLE CHEST 1 VIEW COMPARISON:  None. FINDINGS: The heart size and mediastinal contours are within normal limits. Both lungs are clear. The visualized skeletal structures are unremarkable. IMPRESSION: No active disease. Electronically Signed   By: Jonna Clark M.D.   On: 11/27/2019 19:39    Procedures Procedures (including critical care time)  Medications Ordered in ED Medications  sodium chloride 0.9 % bolus 500 mL (0 mLs Intravenous Stopped 11/27/19 2209)    ED Course  I have reviewed the triage vital signs and the nursing notes.  Pertinent labs & imaging results that were available during my care of the patient were reviewed by me and considered in my medical decision making (see chart for  details).    MDM Rules/Calculators/A&P                         Pt in ED for evaluation of altered mental status and hypoxia after smoking marijuana. On ED presentation patient with pinpoint pupils, somnolence. He was observed for several hours with improvement in his mental status and he was weaned off supplemental oxygen. Presentation is not consistent with acute CVA, PE, pneumonia. Discussed with patient concern for drug use contributing to his symptoms. He was offered resources for rehab. Patient care transferred pending UA and UDS.  Final Clinical Impression(s) / ED Diagnoses Final diagnoses:  Transient alteration of awareness  Substance abuse [facility])    Rx / DC Orders ED Discharge Orders    None       Tilden Fossa, MD 11/27/19 2336

## 2019-11-27 NOTE — ED Provider Notes (Signed)
Blood pressure 121/78, pulse 79, temperature 97.6 F (36.4 C), temperature source Oral, resp. rate (!) 8, height 5\' 7"  (1.702 m), weight 64.3 kg, SpO2 100 %.  Assuming care from Dr. .  In short, Garrett Richardson is a 44 y.o. male with a chief complaint of Altered Mental Status (possible overdose) .  Refer to the original H&P for additional details.  The current plan of care is to f/u on UA and reassess.  12:21 AM  Patient reevaluated after his UA and urine tox have come back.  He has multiple positive values and his UDS which correlates with his history.  He is awake and alert.  When I go to reevaluate him he is sitting on the edge of the bed and is anxious to leave.  His wife is here at bedside and will drive him home.  He does not appear actively intoxicated or otherwise under the influence.  He has several values listed in his ongoing vitals with low respiratory rate but suspect that these are entered in error given patient's clinical appearance here. Plan for discharge.     48, MD 11/28/19 [phone]

## 2019-11-28 LAB — RAPID URINE DRUG SCREEN, HOSP PERFORMED
Amphetamines: POSITIVE — AB
Barbiturates: NOT DETECTED
Benzodiazepines: NOT DETECTED
Cocaine: NOT DETECTED
Opiates: POSITIVE — AB
Tetrahydrocannabinol: POSITIVE — AB

## 2019-12-03 ENCOUNTER — Telehealth (HOSPITAL_COMMUNITY): Payer: Self-pay | Admitting: Licensed Clinical Social Worker

## 2019-12-08 ENCOUNTER — Encounter (HOSPITAL_COMMUNITY): Payer: Self-pay

## 2019-12-08 ENCOUNTER — Emergency Department (HOSPITAL_COMMUNITY)
Admission: EM | Admit: 2019-12-08 | Discharge: 2019-12-10 | Disposition: A | Discharge status: Home / Self Care | Payer: Medicaid Other | Attending: Emergency Medicine | Admitting: Emergency Medicine

## 2019-12-08 ENCOUNTER — Other Ambulatory Visit: Payer: Self-pay

## 2019-12-08 DIAGNOSIS — F1910 Other psychoactive substance abuse, uncomplicated: Secondary | ICD-10-CM | POA: Insufficient documentation

## 2019-12-08 DIAGNOSIS — F329 Major depressive disorder, single episode, unspecified: Secondary | ICD-10-CM | POA: Insufficient documentation

## 2019-12-08 DIAGNOSIS — F1290 Cannabis use, unspecified, uncomplicated: Secondary | ICD-10-CM | POA: Diagnosis not present

## 2019-12-08 DIAGNOSIS — Z20822 Contact with and (suspected) exposure to covid-19: Secondary | ICD-10-CM | POA: Diagnosis not present

## 2019-12-08 DIAGNOSIS — F419 Anxiety disorder, unspecified: Secondary | ICD-10-CM | POA: Insufficient documentation

## 2019-12-08 NOTE — ED Triage Notes (Signed)
Pt comes via [facility] under [facility]C, per paperwork pt has overdosed of heroin several times this week and has a drug problem and stated to his wife "he is better off not here" Pt denies SI/HI/AVH

## 2019-12-09 LAB — COMPREHENSIVE METABOLIC PANEL
ALT: 19 U/L (ref 0–44)
AST: 25 U/L (ref 15–41)
Albumin: 4.4 g/dL (ref 3.5–5.0)
Alkaline Phosphatase: 84 U/L (ref 38–126)
Anion gap: 11 (ref 5–15)
BUN: 9 mg/dL (ref 6–20)
CO2: 24 mmol/L (ref 22–32)
Calcium: 9.8 mg/dL (ref 8.9–10.3)
Chloride: 104 mmol/L (ref 98–111)
Creatinine, Ser: 1.12 mg/dL (ref 0.61–1.24)
GFR, Estimated: >60 mL/min (ref >60)
Glucose, Bld: 118 mg/dL — ABNORMAL HIGH (ref 70–99)
Potassium: 3.7 mmol/L (ref 3.5–5.1)
Sodium: 139 mmol/L (ref 135–145)
Total Bilirubin: 1.1 mg/dL (ref 0.3–1.2)
Total Protein: 7.8 g/dL (ref 6.5–8.1)

## 2019-12-09 LAB — CBC
HCT: 40.1 % (ref 39.0–52.0)
Hemoglobin: 12.8 g/dL — ABNORMAL LOW (ref 13.0–17.0)
MCH: 28.2 pg (ref 26.0–34.0)
MCHC: 31.9 g/dL (ref 30.0–36.0)
MCV: 88.3 fL (ref 80.0–100.0)
Platelets: 364 10*3/uL (ref 150–400)
RBC: 4.54 MIL/uL (ref 4.22–5.81)
RDW: 12.9 % (ref 11.5–15.5)
WBC: 16.7 10*3/uL — ABNORMAL HIGH (ref 4.0–10.5)
nRBC: 0 % (ref 0.0–0.2)

## 2019-12-09 LAB — RESPIRATORY PANEL BY RT PCR (FLU A&B, COVID)
Influenza A by PCR: NEGATIVE
Influenza B by PCR: NEGATIVE
SARS Coronavirus 2 by RT PCR: NEGATIVE

## 2019-12-09 LAB — ACETAMINOPHEN LEVEL: Acetaminophen (Tylenol), Serum: <10 ug/mL — ABNORMAL LOW (ref 10–30)

## 2019-12-09 LAB — ETHANOL: Alcohol, Ethyl (B): <10 mg/dL (ref <10)

## 2019-12-09 LAB — SALICYLATE LEVEL: Salicylate Lvl: <7 mg/dL — ABNORMAL LOW (ref 7.0–30.0)

## 2019-12-09 MED ORDER — ZIPRASIDONE MESYLATE 20 MG IM SOLR: 20.0000 mg | Freq: Once | INTRAMUSCULAR | Status: DC

## 2019-12-09 MED ORDER — ONDANSETRON 4 MG PO TBDP
4.0000 mg | ORAL_TABLET | Freq: Once | ORAL | Status: DC
  Filled 2019-12-09: qty 1

## 2019-12-09 NOTE — ED Notes (Signed)
Pt complaint with staff. Holding Geodon at this time. Continue to monitor for agitation Pt moved to private room for decreased stimuli

## 2019-12-09 NOTE — ED Notes (Signed)
Pt being verbally aggressive towards staff and raising his voice. Security paged to bedside.

## 2019-12-09 NOTE — ED Notes (Signed)
Security at bedside

## 2019-12-09 NOTE — ED Notes (Signed)
Lunch Tray Ordered @ 1050. 

## 2019-12-09 NOTE — ED Notes (Signed)
Patient belongings placed in locker #12.

## 2019-12-09 NOTE — ED Notes (Addendum)
Pt verbally aggressive and raising voice regarding wanting food. Pt refused [place] sandwich initially. Explained to pt that dinner trays have been ordered and we are waiting on them to arrive and we have no influence over when they arrive. Pt finally calmed down after agreeing to the [place] sandwich and receiving the [place] sandwich.

## 2019-12-09 NOTE — ED Provider Notes (Signed)
[facility] EMERGENCY DEPARTMENT Provider Note   CSN: [id number] Arrival date & time: 12/08/19  2224     History Chief Complaint  Patient presents with   IVC    Garrett Richardson is a 44 y.o. male presenting under IVC for psychiatric evaluation and substance abuse.  Patient IVC by wife due to heroin, meth, and alcohol use.  Patient states he feels fine.  He does not need to be here.  He denies SI, HI, or AVH.  He states he only uses weed, although states last time he was here his we might have been laced with something that he did not know about.  He reports no physical complaints at this time.  Additional history obtained from patient's wife.  She states that he has outpatient psychiatric services, however has missed the last 5 appointments.  She states twice this past week he has overdosed, and she and her daughter have found him laying on the floor.  She is concerned for his safety.  If discharge, she is requesting he received Narcan.  She states patient mostly uses fentanyl and heroin.  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   Spinal cord injury, cervical region (HCC) 03/06/2018    Past Surgical History:  Procedure Laterality Date   BACK SURGERY     neck   POSTERIOR CERVICAL FUSION/FORAMINOTOMY N/A 03/07/2018   Procedure: POSTERIOR CERVICAL LAMINAPLASTY CERVICAL THREE-CERVICAL SIX;  Surgeon: [name], MD;  Location: [facility] OR;  Service: Neurosurgery;  Laterality: N/A;  POSTERIOR CERVICAL LAMINAPLASTY CERVICAL THREE-CERVICAL SIX       Family History  Problem Relation Age of Onset   Diabetes Mother     Social History   Tobacco Use   Smoking status: Current Every Day Smoker    Packs/day:  2.00    Types: Cigarettes   Smokeless tobacco: Current User  Vaping Use   Vaping Use: Never used  Substance Use Topics   Alcohol use: Not Currently   Drug use: Not Currently    Types: Other-see comments, Cocaine    Comment: 1 month ago marijuana     Home Medications Prior to Admission medications   Medication Sig Start Date End Date Taking? Authorizing Provider  acetaminophen (TYLENOL) 500 MG tablet Take 500-1,000 mg by mouth every 6 (six) hours as needed for mild pain or headache.   Yes [provider]  IBU 800 MG tablet Take 800 mg by mouth every 8 (eight) hours as needed for pain. 10/01/19  Yes [provider]  amoxicillin-clavulanate (AUGMENTIN) 875-125 MG tablet Take 1 tablet by mouth every 12 (twelve) hours. Patient not taking: Reported on 12/09/2019 07/25/19   Simonne Martinet, NP  celecoxib (CELEBREX) 200 MG capsule Take 200 mg by mouth daily.    [provider]  cetirizine (ZYRTEC) 10 MG tablet Take 10 mg by mouth daily.    [provider]  ergocalciferol (VITAMIN D2) 1.25 MG (50000 UT) capsule Take 50,000 Units by mouth once a week.    [provider]  gabapentin (NEURONTIN) 100 MG capsule Take 100 mg by mouth 2 (two) times daily as needed.    [provider]  methocarbamol (ROBAXIN) 750 MG tablet Take  1,500 mg by mouth 3 (three) times daily as needed for muscle spasms.    [provider]    Allergies    Banana, Bee venom, and Morphine and related  Review of Systems   Review of Systems  All other systems reviewed and are negative.   Physical Exam Updated Vital Signs BP 121/84 (BP Location: Left Arm)    Pulse 67    Temp 98.2 F (36.8 C) (Oral)    Resp 18    SpO2 98%   Physical Exam Vitals and nursing note reviewed.  Constitutional:      General: He is not in acute distress.    Appearance: He is well-developed.     Comments: Appears nontoxic  HENT:     Head: Normocephalic and atraumatic.  Eyes:      Conjunctiva/sclera: Conjunctivae normal.     Pupils: Pupils are equal, round, and reactive to light.  Cardiovascular:     Rate and Rhythm: Normal rate and regular rhythm.     Pulses: Normal pulses.  Pulmonary:     Effort: Pulmonary effort is normal. No respiratory distress.     Breath sounds: Normal breath sounds. No wheezing.  Abdominal:     General: There is no distension.     Palpations: Abdomen is soft. There is no mass.     Tenderness: There is no abdominal tenderness. There is no guarding or rebound.     Comments: emesis x1 after drinking lots of liquids quickly on an empty stomach  Musculoskeletal:        General: Normal range of motion.     Cervical back: Normal range of motion and neck supple.  Skin:    General: Skin is warm and dry.     Capillary Refill: Capillary refill takes less than 2 seconds.  Neurological:     Mental Status: He is alert and oriented to person, place, and time.  Psychiatric:        Mood and Affect: Affect is angry.        Behavior: Behavior is agitated and aggressive.     Comments: Patient behavior escalating throughout the history and exam.  Becoming increasingly upset that his lunch tray is delayed and that he is still in the ER.     ED Results / Procedures / Treatments   Labs (all labs ordered are listed, but only abnormal results are displayed) Labs Reviewed  COMPREHENSIVE METABOLIC PANEL - Abnormal; Notable for the following components:      Result Value   Glucose, Bld 118 (*)    All other components within normal limits  SALICYLATE LEVEL - Abnormal; Notable for the following components:   Salicylate Lvl <7.0 (*)    All other components within normal limits  ACETAMINOPHEN LEVEL - Abnormal; Notable for the following components:   Acetaminophen (Tylenol), Serum <10 (*)    All other components within normal limits  CBC - Abnormal; Notable for the following components:   WBC 16.7 (*)    Hemoglobin 12.8 (*)    All other components within  normal limits  RESPIRATORY PANEL BY RT PCR (FLU A&B, COVID)  ETHANOL  RAPID URINE DRUG SCREEN, HOSP PERFORMED    EKG None  Radiology No results found.  Procedures Procedures (including critical care time)  Medications Ordered in ED Medications  ondansetron (ZOFRAN-ODT) disintegrating tablet 4 mg (4 mg Oral Not Given 12/09/19 1535)  ziprasidone (GEODON) injection 20 mg (0 mg Intramuscular Hold 12/09/19 1834)    ED Course  I have reviewed the triage vital signs and the nursing notes.  Pertinent labs & imaging results that were available during my care of the patient were reviewed by me and considered in my medical decision making (see chart for details).   MDM Rules/Calculators/A&P                          Pt presenting under IVC for substance abuse and psych eval. On exam, pt appears nontoxic. Labs obtained from triage interpreted by me, overall reassuring. Will consult with TTS.   Pt becoming increasingly agitated. When given food and moved to a room, pt is less agitated and redirectable.   First look paperwork completed.   TTS pending.   The patient has been placed in psychiatric observation due to the need to provide a safe environment for the patient while obtaining psychiatric consultation and evaluation, as well as ongoing medical and medication management to treat the patient's condition.  The patient has been placed under full IVC at this time.   Final Clinical Impression(s) / ED Diagnoses Final diagnoses:  Polysubstance abuse [facility])    Rx / DC Orders ED Discharge Orders    None       Alveria Apley, PA-C 12/09/19 2041    Pollyann Savoy, MD 12/10/19 [phone]

## 2019-12-09 NOTE — ED Notes (Signed)
TTS in process 

## 2019-12-09 NOTE — ED Notes (Addendum)
RN spoke with wife who wish to be contacted for collateral information after TTS is completed. Wife has concerns about safety for the patient and the family as a whole-Monique,RN

## 2019-12-09 NOTE — ED Notes (Signed)
Breakfast Tray reordered @ 1023. 

## 2019-12-10 NOTE — Progress Notes (Signed)
Pt has been psychiatrically cleared. Outpatient resources contact information has been placed in pt's AVS.    Wells Guiles, MSW, LCSW, LCAS Clinical Social Worker II Disposition CSW [phone]

## 2019-12-10 NOTE — ED Notes (Signed)
Wife, Garrett Richardson would like an update [phone]

## 2019-12-10 NOTE — BH Assessment (Signed)
Tele Assessment Note   Patient Name: Garrett PellegriniBrad J Seres MRN: [id number] Referring Physician: Alveria ApleySophia Caccavale, PA Location of Patient: [facility] Location of Provider: Behavioral Health TTS Department  Garrett Richardson is an 44 y.o. male.  -Clinician reviewed note by Alveria ApleySophia Caccavale, PA.  Patient states he feels fine.  He does not need to be here.  He denies SI, HI, or AVH.  He states he only uses weed, although states last time he was here his we might have been laced with something that he did not know about.  He reports no physical complaints at this time. Additional history obtained from patient's wife.  She states that he has outpatient psychiatric services, however has missed the last 5 appointments.  She states twice this past week he has overdosed, and she and her daughter have found him laying on the floor.  She is concerned for his safety.  If discharge, she is requesting he received Narcan.  She states patient mostly uses fentanyl and heroin.  Patient says that he is not having any SI plan or intention.  He also denies any HI or A/V hallucinations.  Patient says that he does drink ETOH and that he had drank a bottle on Sunday evening (10/17).  He says he also smokes marijuana daily, multiple times in a day.  Patient says that he does not seek out opioids but believes his marijuana may be laced with heroin.    Patient remembers that he did pass out earlier in the week, before 10/16).  He is not sure why he passed out then.  Patient says that he passed out on 10/17 and wife called EMS.  Patient says that he did drink a bottle of liquor and smoked marijuana.  He denies any use of opioids in either passing out episodes.  Patient says that wife told EMS on 10/17 that she had used narcan but he did not think she had.  Patient says that he has a heart condition and that may be why he is passing out.  Patient says he knows that he has a drug problem.  He says "I can take a class or something."  Pt does not  appear to want to address drug problem by going to detox or a rehabilitation center.  He did say he went to a facility in the past 1-2 years in [place].  Patient says that his wife does this to him when she is upset with him.  Patient says that she had him IVC'ed in April and January of this year.  Pt does have two episodes in Epic of being IVC'ed.  Patient admits to some depression and anxiety.  Patient has good eye contact and is oriented x4.  Patient voice is loud and excitable at times.  Patient admits to anxiety.  He is not engaged in delusional thinking and is not responding to internal stimuli.  Patient reports good appetite but wishes to "put on more weight."  He says he does not always sleep well, will get up and down at night.    Clinician contacted petitioner for additional information.  Petitioner says she did administer narcan on Sunday 10/17.  She said that he says he does not have a drug addition but she says he does.  She said that he has missed five mental health appointments (via Zoom).  Petitioner says patient gets very angry when he is using drugs.  She says that drugs are readily available in their neighborhood.  Petitioner says that  she is tired of this situation.  She says that patient texted her two weeks ago that he "did not want to be around."  She is at the point of leaving him or getting a restraining order.  Patient is worried about missing court on 10/21.  He says he had outpatient therapy but cannot recall who with.  Patient says he has been to a rehab facility in [place].  -Clinician discussed patient care with Nira Conn, FNP.  He recommended that patient be observed overnight and have psychiatry review IVC.  Clinician informed nurse Monique of the disposition.     Diagnosis: Cannabis use d/o severe; Polysubstance use d/o; MDD recurrent, mild; Generalized anxiety d/o  Past Medical History:  Past Medical History:  Diagnosis Date  . Spondylolysis     Past  Surgical History:  Procedure Laterality Date  . BACK SURGERY     neck  . POSTERIOR CERVICAL FUSION/FORAMINOTOMY N/A 03/07/2018   Procedure: POSTERIOR CERVICAL LAMINAPLASTY CERVICAL THREE-CERVICAL SIX;  Surgeon: [name], MD;  Location: [facility] OR;  Service: Neurosurgery;  Laterality: N/A;  POSTERIOR CERVICAL LAMINAPLASTY CERVICAL THREE-CERVICAL SIX    Family History:  Family History  Problem Relation Age of Onset  . Diabetes Mother     Social History:  reports that he has been smoking cigarettes. He has been smoking about 2.00 packs per day. He uses smokeless tobacco. He reports previous alcohol use. He reports previous drug use. Drugs: Other-see comments and Cocaine.  Additional Social History:  Alcohol / Drug Use Pain Medications: See PTA medication list Prescriptions: See PTA medication list Over the Counter: See PTA medication list. History of alcohol / drug use?: Yes Substance #1 Name of Substance 1: Marijuana 1 - Age of First Use: teens 1 - Amount (size/oz): A blunt 1 - Frequency: daily 1 - Duration: ongoing 1 - Last Use / Amount: 10/17 Substance #2 Name of Substance 2: Cocaine 2 - Age of First Use: Unknown 2 - Amount (size/oz): Varies 2 - Frequency: "Just out out of the blue) 2 - Duration: off and on 2 - Last Use / Amount: A few days ago Substance #3 Name of Substance 3: Opioids 3 - Age of First Use: Unknown 3 - Amount (size/oz): 1 pt 3 - Frequency: Unknown 3 - Last Use / Amount: 02/28/2019  CIWA: CIWA-Ar BP: 126/79 Pulse Rate: (!) 57 COWS:    Allergies:  Allergies  Allergen Reactions  . Banana Anaphylaxis, Swelling and Other (See Comments)    "Throat swells"  . Bee Venom Anaphylaxis, Swelling and Other (See Comments)    "Throat swells"  . Morphine And Related Itching and Other (See Comments)    Tolerates Dilaudid and Oxycodone (by mouth)    Home Medications: (Not in a hospital admission)   OB/GYN Status:  No LMP for male patient.  General  Assessment Data Location of Assessment: [facility] ED TTS Assessment: In system Is this a Tele or Face-to-Face Assessment?: Tele Assessment Is this an Initial Assessment or a Re-assessment for this encounter?: Initial Assessment Patient Accompanied by:: N/A Language Other than English: No Living Arrangements: Other (Comment) (Pt lives with wife.) What gender do you identify as?: Male Date Telepsych consult ordered in [facility]: 12/09/19 Time Telepsych consult ordered in [facility]: 1500 Marital status: Married Pregnancy Status: No Living Arrangements: Spouse/significant other, Children Can pt return to current living arrangement?: Yes Admission Status: Involuntary Petitioner: Family member Is patient capable of signing voluntary admission?: No Referral Source: Self/Family/Friend Insurance type: M[place]  Crisis Care Plan Living Arrangements: Spouse/significant other, Children Name of Psychiatrist: [facility] Name of Therapist: [facility]  Education Status Is patient currently in school?: No Is the patient employed, unemployed or receiving disability?: Unemployed  Risk to self with the past 6 months Suicidal Ideation: No Has patient been a risk to self within the past 6 months prior to admission? : No Suicidal Intent: No Has patient had any suicidal intent within the past 6 months prior to admission? : No Is patient at risk for suicide?: No Suicidal Plan?: No Has patient had any suicidal plan within the past 6 months prior to admission? : No Access to Means: No What has been your use of drugs/alcohol within the last 12 months?: ETOH, THC, cocaine Previous Attempts/Gestures: No How many times?: 0 Other Self Harm Risks: Drug use Triggers for Past Attempts: None known Intentional Self Injurious Behavior: None Family Suicide History: No Recent stressful life event(s): Conflict (Comment) (Conflict with spouse) Persecutory voices/beliefs?: Yes Depression: Yes Depression  Symptoms: Feeling angry/irritable Substance abuse history and/or treatment for substance abuse?: Yes Suicide prevention information given to non-admitted patients: Not applicable  Risk to Others within the past 6 months Homicidal Ideation: No Does patient have any lifetime risk of violence toward others beyond the six months prior to admission? : No Thoughts of Harm to Others: No Current Homicidal Intent: No Current Homicidal Plan: No Access to Homicidal Means: No Identified Victim: No one History of harm to others?: No Assessment of Violence: None Noted Violent Behavior Description: No one Does patient have access to weapons?: No Criminal Charges Pending?: Yes Describe Pending Criminal Charges: Probation violation Does patient have a court date: Yes Court Date: 12/12/19 Is patient on probation?: Yes  Psychosis Hallucinations: None noted Delusions: None noted  Mental Status Report Appearance/Hygiene: Unremarkable, In scrubs Eye Contact: Good Motor Activity: Freedom of movement, Unremarkable Speech: Logical/coherent, Loud, Rapid Level of Consciousness: Alert Mood: Anxious Affect: Anxious, Depressed Anxiety Level: Moderate Thought Processes: Coherent, Relevant Judgement: Unimpaired Orientation: Person, Place, Situation, Time Obsessive Compulsive Thoughts/Behaviors: None  Cognitive Functioning Concentration: Normal Memory: Recent Intact, Remote Intact Is patient IDD: No Insight: Fair Impulse Control: Poor Appetite: Good Have you had any weight changes? : No Change Sleep: Decreased Total Hours of Sleep:  (Up and down at night) Vegetative Symptoms: None  ADLScreening [facility] Assessment Services) Patient's cognitive ability adequate to safely complete daily activities?: Yes Patient able to express need for assistance with ADLs?: Yes Independently performs ADLs?: Yes (appropriate for developmental age)  Prior Inpatient Therapy Prior Inpatient Therapy: Yes Prior  Therapy Dates: 2-3 years ago Prior Therapy Facilty/Provider(s): A facility in [place] Reason for Treatment: rehab  Prior Outpatient Therapy Prior Outpatient Therapy: Yes Prior Therapy Dates: A month Prior Therapy Facilty/Provider(s): [facility] Reason for Treatment: Med management Does patient have an ACCT team?: No Does patient have Intensive In-House Services?  : No Does patient have [facility] services? : No Does patient have P4CC services?: No  ADL Screening (condition at time of admission) Patient's cognitive ability adequate to safely complete daily activities?: Yes Is the patient deaf or have difficulty hearing?: No Does the patient have difficulty seeing, even when wearing glasses/contacts?: No (Wears glasses) Does the patient have difficulty concentrating, remembering, or making decisions?: No Patient able to express need for assistance with ADLs?: Yes Does the patient have difficulty dressing or bathing?: No Independently performs ADLs?: Yes (appropriate for developmental age) Does the patient have difficulty walking or climbing stairs?: No Weakness of Legs: None  Weakness of Arms/Hands: None  Home Assistive Devices/Equipment Home Assistive Devices/Equipment: None    Abuse/Neglect Assessment (Assessment to be complete while patient is alone) Abuse/Neglect Assessment Can Be Completed: Yes Physical Abuse: Denies Verbal Abuse: Denies Sexual Abuse: Denies Exploitation of patient/patient's resources: Denies                Disposition:  Disposition Initial Assessment Completed for this Encounter: Yes  This service was provided via telemedicine using a 2-way, interactive audio and video technology.  Names of all persons participating in this telemedicine service and their role in this encounter. Name: Lamaj Metoyer Role: patient  Name: Sherron Flemings Role: spouse  Name: Beatriz Stallion, M.S. LCAS QP Role: clinician  Name:  Role:     [place] 12/10/2019 12:49 AM

## 2019-12-10 NOTE — Progress Notes (Addendum)
Patient ID: Garrett Richardson, male   DOB: 12-24-1975, 44 y.o.   MRN: [id number]   Psychiatric Reassessment   [name] is an 44 y.o. male.  -Clinician reviewed note by Alveria Apley, PA.  Patient states he feels fine. He does not need to be here. He denies SI, HI, or AVH. He states he only uses weed, although states last time he was here his we might have been laced with something that he did not know about. He reports no physical complaints at this time. Additional history obtained from patient's wife. She states that he has outpatient psychiatric services, however has missed the last 5 appointments. She states twice this past week he has overdosed, and she and her daughter have found him laying on the floor. She is concerned for his safety. If discharge, she is requesting he received Narcan. She states patient mostly uses fentanyl and heroin.  Patient says that he is not having any SI plan or intention.  He also denies any HI or A/V hallucinations.  Patient says that he does drink ETOH and that he had drank a bottle on Sunday evening (10/17).  He says he also smokes marijuana daily, multiple times in a day.  Patient says that he does not seek out opioids but believes his marijuana may be laced with heroin.    Patient remembers that he did pass out earlier in the week, before 10/16).  He is not sure why he passed out then.  Patient says that he passed out on 10/17 and wife called EMS.  Patient says that he did drink a bottle of liquor and smoked marijuana.  He denies any use of opioids in either passing out episodes.  Patient says that wife told EMS on 10/17 that she had used narcan but he did not think she had.  Patient says that he has a heart condition and that may be why he is passing out.  Patient says he knows that he has a drug problem.  He says "I can take a class or something."  Pt does not appear to want to address drug problem by going to detox or a rehabilitation  center.  He did say he went to a facility in the past 1-2 years in [place].  Patient says that his wife does this to him when she is upset with him.  Patient says that she had him IVC'ed in April and January of this year.  Pt does have two episodes in Epic of being IVC'ed.  Patient admits to some depression and anxiety.  Patient has good eye contact and is oriented x4.  Patient voice is loud and excitable at times.  Patient admits to anxiety.  He is not engaged in delusional thinking and is not responding to internal stimuli.  Patient reports good appetite but wishes to "put on more weight."  He says he does not always sleep well, will get up and down at night.    Clinician contacted petitioner for additional information.  Petitioner says she did administer narcan on Sunday 10/17.  She said that he says he does not have a drug addition but she says he does.  She said that he has missed five mental health appointments (via Zoom).  Petitioner says patient gets very angry when he is using drugs.  She says that drugs are readily available in their neighborhood.  Petitioner says that she is tired of this situation.  She says that patient texted her two weeks ago  that he "did not want to be around."  She is at the point of leaving him or getting a restraining order.  Patient is worried about missing court on 10/21.  He says he had outpatient therapy but cannot recall who with.  Patient says he has been to a rehab facility in [place].   Psychiatric evaluation: Garrett Richardson is ai 44 year old male who presented to [facility] after being IVC'd by his wife.  Patient has a long history of substance abuse. He has been evaluated by our psychiatric team in the past after being IVC'd by his wife, again, for substance abuse which led to hostile &aggressive behaviors, and hallucinations.   During this evaluation, patient was alert and oriented x4, calm and cooperative. He denied SI, HI and psychosis. His insight was poor  as   minimized his substance abuse issues. In stated of talking about his issues, he spoke more about his wife and their marital issues. He stated," every time she get upset, she IVC me so I can come here. Then she make it try to sound bad like I am saying things like I want to hurt myself." He admitted to using heroin a few days ago but stated," somebody put it in my weed." Added, "my wife told me not to buy weed from that person again but she has no problem when I buy it from her sister. The other day, she even bought me a bottle of liquor,"  He admitted to almost daily use of marijuana and drinking alcohol but denied other substance abuse or use. He has a history of using meth but stated," I haven't used meth ain a very long time." When asked if he made the comment to his wife; Im am better off not being here he replied" yeah but I was saying better off not being around her and she is making it seem like Im saying I want to kill myself over her and I most defiantly wont. My life is not worth dying over her." He denied history suicide attempts or NSSIB. When asked if he had recent unintentionally drug overdoses and if he recently had to be narcaned he replied," that happened when I smoked the weed but my wife has never used Narcan. She don't even have Narcan. She was a nurse for years and still want to be a nurse so bad that she lied talking about she gave me Narcan."  He reported going to [facility] for counseling. Denied having a psychiatrist. Reported he was in rehab three years ago and that was the las time he rectified substance abuse inpatient treatment. When asked if he was open to treatment, he replied," yeah but we are in the process of moving. " He then changed the subject. He denied acces to firearms.   Disposition: Patient denies SI, HI and psychosis. He has a long documented history of polysubstance abuse although he minimizes his use. We discussed inpatient rehab and had an excuse that they  were in the process of moving. Stated that his plan was to continue outpatient counseling  at [facility].   At this time, there is no evidence of imminent risk to self or others at present.  Patient does not meet criteria for psychiatric inpatient admission. I spoke to patients wife with verbal consent given by patient and she stated that she figured patient would decline substance abuse services despite his long struggle with drug use. Stated that she did not give the Narcan  because she was not at home but their39 year old daughter did. Stated, she is very concerned that patient will unintentionally kill himself one day because of his drug use. Stated  she has tried to encourage him numerous times to go to rehab however, he makes excuses not to.  She was receptive with the disposition and advised that although patient declined resources, resources would be provided. It was reccommended to patient that he continue follow-up with current  outpatient services..Per review of chart, peer support consultation was placed  yesterday.    Patient again is psychiatrically cleared. ED updated on disposition.

## 2019-12-10 NOTE — ED Provider Notes (Signed)
Emergency Medicine Observation Re-evaluation Note  Garrett Richardson is a 44 y.o. male, seen on rounds today.  Pt initially presented to the ED for complaints of IVC Currently, the patient is admitted to the ed cleared by psychiatry.  Physical Exam  BP 114/80 (BP Location: Right Leg)   Pulse (!) 55   Temp 98.4 F (36.9 C) (Oral)   Resp 16   SpO2 98%  Physical Exam General: well appearing resting comforably Cardiac: Regular rate rhythm Lungs: Clear to auscultation no increased work of breathing Psych: Calm cooperative no suicidal ideation  ED Course / MDM  EKG:  Clinical Course as of Dec 10 1222  Mon Dec 09, 2019  1447 Rapid urine drug screen (hospital performed) [MW]    Clinical Course User Index [MW] Desmond Lope, Student-PA   I have reviewed the labs performed to date as well as medications administered while in observation.  Recent changes in the last 24 hours include no significant changes.  Plan  Current plan is for discharge for outpatient follow-up. Patient is not under full IVC at this time.   Sabino Donovan, MD 12/10/19 1229

## 2019-12-10 NOTE — Discharge Instructions (Signed)
Please follow up with one of the following outpatient providers:  [facility] [address] [place], [place] [address] [phone] Private insurance, Medicare A&B, and [facility]  [facility] [address] [place], [place] [address] [phone] Medicaid, Self Pay  [facility] [address] [place], [place] [phone] Medicaid and [facility], Self Pay  [facility] [address] [place], [place] [phone] [facility], and Self Pay  [facility] [address] [place], [place] [address] [phone] or [phone] Private Insurance Only  [facility] [address] Ginette Otto, [place] [address] [phone] Medicaid, Medicare, Private Insurance  [facility] at [facility] [address] [place], [place] [address] [phone] Services are free or reduced  [facility] [address] [phone] Self Pay only, sliding scale  [facility] [address] [place], [place] [address] [phone] ([facility]) Self Pay, Medicaid Only  [facility] [address], [place] [address] [phone] Medicaid, Medicare, Private Insurance

## 2019-12-10 NOTE — ED Notes (Signed)
Lunch Tray Ordered 

## 2019-12-17 ENCOUNTER — Telehealth (HOSPITAL_COMMUNITY): Payer: Self-pay | Admitting: Licensed Clinical Social Worker
